# Patient Record
Sex: Female | Born: 1955
Health system: Southern US, Community
[De-identification: ages and names within clinical notes are randomized; demographics above are authoritative.]

## PROBLEM LIST (undated history)

## (undated) DIAGNOSIS — Z87898 Personal history of other specified conditions: Secondary | ICD-10-CM

## (undated) DIAGNOSIS — Z860100 Personal history of colon polyps, unspecified: Secondary | ICD-10-CM

## (undated) DIAGNOSIS — M810 Age-related osteoporosis without current pathological fracture: Secondary | ICD-10-CM

## (undated) DIAGNOSIS — N950 Postmenopausal bleeding: Secondary | ICD-10-CM

## (undated) DIAGNOSIS — K219 Gastro-esophageal reflux disease without esophagitis: Secondary | ICD-10-CM

## (undated) DIAGNOSIS — M2550 Pain in unspecified joint: Secondary | ICD-10-CM

## (undated) DIAGNOSIS — Z8709 Personal history of other diseases of the respiratory system: Secondary | ICD-10-CM

## (undated) DIAGNOSIS — Z8719 Personal history of other diseases of the digestive system: Secondary | ICD-10-CM

## (undated) HISTORY — PX: TUBAL LIGATION: SHX77

## (undated) HISTORY — PX: CYSTECTOMY: SUR359

## (undated) HISTORY — DX: Postmenopausal bleeding: N95.0

## (undated) HISTORY — DX: Gastro-esophageal reflux disease without esophagitis: K21.9

## (undated) HISTORY — PX: IRRIGATION AND DEBRIDEMENT SEBACEOUS CYST: SHX5255

## (undated) HISTORY — DX: Age-related osteoporosis without current pathological fracture: M81.0

---

## 1992-01-11 HISTORY — PX: OTHER SURGICAL HISTORY: SHX169

## 1997-06-12 ENCOUNTER — Other Ambulatory Visit: Admission: RE | Admit: 1997-06-12 | Discharge: 1997-06-12 | Payer: Self-pay | Admitting: Family Medicine

## 1997-10-07 ENCOUNTER — Other Ambulatory Visit: Admission: RE | Admit: 1997-10-07 | Discharge: 1997-10-07 | Payer: Self-pay | Admitting: Family Medicine

## 1998-10-29 ENCOUNTER — Encounter: Payer: Self-pay | Admitting: Family Medicine

## 1998-10-29 ENCOUNTER — Encounter: Admission: RE | Admit: 1998-10-29 | Discharge: 1998-10-29 | Payer: Self-pay | Admitting: Family Medicine

## 1999-08-06 ENCOUNTER — Encounter: Payer: Self-pay | Admitting: Family Medicine

## 1999-08-06 ENCOUNTER — Encounter: Admission: RE | Admit: 1999-08-06 | Discharge: 1999-08-06 | Payer: Self-pay | Admitting: Family Medicine

## 1999-09-17 ENCOUNTER — Encounter: Admission: RE | Admit: 1999-09-17 | Discharge: 1999-09-17 | Payer: Self-pay | Admitting: Family Medicine

## 1999-09-17 ENCOUNTER — Encounter: Payer: Self-pay | Admitting: Family Medicine

## 1999-11-01 ENCOUNTER — Encounter: Admission: RE | Admit: 1999-11-01 | Discharge: 1999-11-01 | Payer: Self-pay | Admitting: Obstetrics and Gynecology

## 1999-11-01 ENCOUNTER — Encounter: Payer: Self-pay | Admitting: Obstetrics and Gynecology

## 2000-10-03 ENCOUNTER — Other Ambulatory Visit: Admission: RE | Admit: 2000-10-03 | Discharge: 2000-10-03 | Payer: Self-pay | Admitting: Obstetrics and Gynecology

## 2000-11-02 ENCOUNTER — Encounter: Payer: Self-pay | Admitting: Obstetrics and Gynecology

## 2000-11-02 ENCOUNTER — Encounter: Admission: RE | Admit: 2000-11-02 | Discharge: 2000-11-02 | Payer: Self-pay | Admitting: Obstetrics and Gynecology

## 2001-10-10 ENCOUNTER — Other Ambulatory Visit: Admission: RE | Admit: 2001-10-10 | Discharge: 2001-10-10 | Payer: Self-pay | Admitting: Obstetrics and Gynecology

## 2001-11-15 ENCOUNTER — Encounter: Admission: RE | Admit: 2001-11-15 | Discharge: 2001-11-15 | Payer: Self-pay | Admitting: Obstetrics and Gynecology

## 2001-11-15 ENCOUNTER — Encounter: Payer: Self-pay | Admitting: Obstetrics and Gynecology

## 2002-10-22 ENCOUNTER — Other Ambulatory Visit: Admission: RE | Admit: 2002-10-22 | Discharge: 2002-10-22 | Payer: Self-pay | Admitting: Obstetrics and Gynecology

## 2002-11-19 ENCOUNTER — Encounter: Admission: RE | Admit: 2002-11-19 | Discharge: 2002-11-19 | Payer: Self-pay | Admitting: Obstetrics and Gynecology

## 2003-10-24 ENCOUNTER — Other Ambulatory Visit: Admission: RE | Admit: 2003-10-24 | Discharge: 2003-10-24 | Payer: Self-pay | Admitting: Obstetrics and Gynecology

## 2003-11-20 ENCOUNTER — Encounter: Admission: RE | Admit: 2003-11-20 | Discharge: 2003-11-20 | Payer: Self-pay | Admitting: Obstetrics and Gynecology

## 2004-06-09 ENCOUNTER — Ambulatory Visit (HOSPITAL_BASED_OUTPATIENT_CLINIC_OR_DEPARTMENT_OTHER): Admission: RE | Admit: 2004-06-09 | Discharge: 2004-06-09 | Payer: Self-pay | Admitting: *Deleted

## 2004-06-09 ENCOUNTER — Ambulatory Visit (HOSPITAL_COMMUNITY): Admission: RE | Admit: 2004-06-09 | Discharge: 2004-06-09 | Payer: Self-pay | Admitting: *Deleted

## 2004-06-09 ENCOUNTER — Encounter (INDEPENDENT_AMBULATORY_CARE_PROVIDER_SITE_OTHER): Payer: Self-pay | Admitting: *Deleted

## 2004-10-18 ENCOUNTER — Other Ambulatory Visit: Admission: RE | Admit: 2004-10-18 | Discharge: 2004-10-18 | Payer: Self-pay | Admitting: Obstetrics and Gynecology

## 2005-01-04 ENCOUNTER — Encounter: Admission: RE | Admit: 2005-01-04 | Discharge: 2005-01-04 | Payer: Self-pay | Admitting: Obstetrics and Gynecology

## 2005-05-02 ENCOUNTER — Emergency Department (HOSPITAL_COMMUNITY): Admission: EM | Admit: 2005-05-02 | Discharge: 2005-05-02 | Payer: Self-pay | Admitting: Emergency Medicine

## 2005-11-14 ENCOUNTER — Other Ambulatory Visit: Admission: RE | Admit: 2005-11-14 | Discharge: 2005-11-14 | Payer: Self-pay | Admitting: Obstetrics & Gynecology

## 2005-12-10 DIAGNOSIS — N950 Postmenopausal bleeding: Secondary | ICD-10-CM

## 2005-12-10 HISTORY — DX: Postmenopausal bleeding: N95.0

## 2005-12-12 ENCOUNTER — Ambulatory Visit (HOSPITAL_COMMUNITY): Admission: RE | Admit: 2005-12-12 | Discharge: 2005-12-12 | Payer: Self-pay | Admitting: Gastroenterology

## 2005-12-12 ENCOUNTER — Encounter (INDEPENDENT_AMBULATORY_CARE_PROVIDER_SITE_OTHER): Payer: Self-pay | Admitting: Specialist

## 2005-12-12 HISTORY — PX: COLONOSCOPY W/ BIOPSIES: SHX1374

## 2006-01-25 ENCOUNTER — Encounter: Admission: RE | Admit: 2006-01-25 | Discharge: 2006-01-25 | Payer: Self-pay | Admitting: Obstetrics and Gynecology

## 2006-11-20 ENCOUNTER — Other Ambulatory Visit: Admission: RE | Admit: 2006-11-20 | Discharge: 2006-11-20 | Payer: Self-pay | Admitting: Obstetrics & Gynecology

## 2007-05-07 ENCOUNTER — Encounter: Admission: RE | Admit: 2007-05-07 | Discharge: 2007-05-07 | Payer: Self-pay | Admitting: Obstetrics and Gynecology

## 2007-06-15 ENCOUNTER — Ambulatory Visit (HOSPITAL_COMMUNITY): Admission: RE | Admit: 2007-06-15 | Discharge: 2007-06-15 | Payer: Self-pay | Admitting: Otolaryngology

## 2008-01-14 ENCOUNTER — Other Ambulatory Visit: Admission: RE | Admit: 2008-01-14 | Discharge: 2008-01-14 | Payer: Self-pay | Admitting: Obstetrics and Gynecology

## 2008-05-12 ENCOUNTER — Encounter: Admission: RE | Admit: 2008-05-12 | Discharge: 2008-05-12 | Payer: Self-pay | Admitting: Obstetrics and Gynecology

## 2008-07-08 ENCOUNTER — Emergency Department (HOSPITAL_COMMUNITY): Admission: EM | Admit: 2008-07-08 | Discharge: 2008-07-08 | Payer: Self-pay | Admitting: Emergency Medicine

## 2008-10-15 ENCOUNTER — Encounter: Payer: Self-pay | Admitting: Internal Medicine

## 2008-10-15 LAB — CONVERTED CEMR LAB
Cholesterol: 223 mg/dL
HDL: 83 mg/dL
LDL Cholesterol: 102 mg/dL
TSH: 1.67 microintl units/mL
Triglyceride fasting, serum: 81 mg/dL

## 2009-04-10 ENCOUNTER — Encounter: Payer: Self-pay | Admitting: Internal Medicine

## 2009-04-10 LAB — CONVERTED CEMR LAB

## 2009-05-10 LAB — HM DEXA SCAN

## 2009-05-18 ENCOUNTER — Encounter: Admission: RE | Admit: 2009-05-18 | Discharge: 2009-05-18 | Payer: Self-pay | Admitting: Obstetrics and Gynecology

## 2010-01-31 ENCOUNTER — Encounter: Payer: Self-pay | Admitting: Obstetrics and Gynecology

## 2010-03-08 ENCOUNTER — Other Ambulatory Visit: Payer: Self-pay | Admitting: Internal Medicine

## 2010-03-08 ENCOUNTER — Other Ambulatory Visit: Payer: Commercial Managed Care - PPO

## 2010-03-08 ENCOUNTER — Encounter (INDEPENDENT_AMBULATORY_CARE_PROVIDER_SITE_OTHER): Payer: Self-pay | Admitting: *Deleted

## 2010-03-08 DIAGNOSIS — Z0000 Encounter for general adult medical examination without abnormal findings: Secondary | ICD-10-CM

## 2010-03-08 LAB — CBC WITH DIFFERENTIAL/PLATELET
Basophils Absolute: 0 10*3/uL (ref 0.0–0.1)
Basophils Relative: 0.3 % (ref 0.0–3.0)
Eosinophils Absolute: 0.2 10*3/uL (ref 0.0–0.7)
Eosinophils Relative: 2.7 % (ref 0.0–5.0)
HCT: 39.9 % (ref 36.0–46.0)
Hemoglobin: 14.1 g/dL (ref 12.0–15.0)
Lymphocytes Relative: 20.2 % (ref 12.0–46.0)
Lymphs Abs: 1.4 10*3/uL (ref 0.7–4.0)
MCHC: 35.3 g/dL (ref 30.0–36.0)
MCV: 93.9 fl (ref 78.0–100.0)
Monocytes Absolute: 0.8 10*3/uL (ref 0.1–1.0)
Monocytes Relative: 11.2 % (ref 3.0–12.0)
Neutro Abs: 4.6 10*3/uL (ref 1.4–7.7)
Neutrophils Relative %: 65.6 % (ref 43.0–77.0)
Platelets: 207 10*3/uL (ref 150.0–400.0)
RBC: 4.25 Mil/uL (ref 3.87–5.11)
RDW: 12.7 % (ref 11.5–14.6)
WBC: 7 10*3/uL (ref 4.5–10.5)

## 2010-03-08 LAB — URINALYSIS, ROUTINE W REFLEX MICROSCOPIC
Bilirubin Urine: NEGATIVE
Hgb urine dipstick: NEGATIVE
Ketones, ur: NEGATIVE
Nitrite: NEGATIVE
Specific Gravity, Urine: 1.025 (ref 1.000–1.030)
Total Protein, Urine: NEGATIVE
Urine Glucose: NEGATIVE
Urobilinogen, UA: 0.2 (ref 0.0–1.0)
pH: 6 (ref 5.0–8.0)

## 2010-03-08 LAB — LIPID PANEL
Cholesterol: 178 mg/dL (ref 0–200)
HDL: 63.7 mg/dL (ref >39.00)
LDL Cholesterol: 99 mg/dL (ref 0–99)
Total CHOL/HDL Ratio: 3
Triglycerides: 75 mg/dL (ref 0.0–149.0)
VLDL: 15 mg/dL (ref 0.0–40.0)

## 2010-03-08 LAB — HEPATIC FUNCTION PANEL
ALT: 16 U/L (ref 0–35)
AST: 19 U/L (ref 0–37)
Albumin: 4.2 g/dL (ref 3.5–5.2)
Alkaline Phosphatase: 43 U/L (ref 39–117)
Bilirubin, Direct: 0.1 mg/dL (ref 0.0–0.3)
Total Bilirubin: 0.6 mg/dL (ref 0.3–1.2)
Total Protein: 6.6 g/dL (ref 6.0–8.3)

## 2010-03-08 LAB — BASIC METABOLIC PANEL
BUN: 15 mg/dL (ref 6–23)
CO2: 29 mEq/L (ref 19–32)
Calcium: 9.1 mg/dL (ref 8.4–10.5)
Chloride: 105 mEq/L (ref 96–112)
Creatinine, Ser: 0.7 mg/dL (ref 0.4–1.2)
GFR: 90.7 mL/min (ref >60.00)
Glucose, Bld: 79 mg/dL (ref 70–99)
Potassium: 4.7 mEq/L (ref 3.5–5.1)
Sodium: 141 mEq/L (ref 135–145)

## 2010-03-08 LAB — TSH: TSH: 2.21 u[IU]/mL (ref 0.35–5.50)

## 2010-03-11 DIAGNOSIS — K219 Gastro-esophageal reflux disease without esophagitis: Secondary | ICD-10-CM | POA: Insufficient documentation

## 2010-03-12 ENCOUNTER — Encounter: Payer: Self-pay | Admitting: Internal Medicine

## 2010-03-12 ENCOUNTER — Ambulatory Visit (INDEPENDENT_AMBULATORY_CARE_PROVIDER_SITE_OTHER): Payer: Commercial Managed Care - PPO | Admitting: Internal Medicine

## 2010-03-12 DIAGNOSIS — H9319 Tinnitus, unspecified ear: Secondary | ICD-10-CM | POA: Insufficient documentation

## 2010-03-12 DIAGNOSIS — Z0000 Encounter for general adult medical examination without abnormal findings: Secondary | ICD-10-CM

## 2010-03-12 DIAGNOSIS — Z860100 Personal history of colon polyps, unspecified: Secondary | ICD-10-CM | POA: Insufficient documentation

## 2010-03-18 NOTE — Assessment & Plan Note (Signed)
Summary: New pt Cpx,#,umr,cd   Vital Signs:  Patient profile:   55 year old female Height:      63.5 inches (161.29 cm) Weight:      135.12 pounds (61.42 kg) BMI:     23.65 O2 Sat:      99 % on Room air Temp:     97.8 degrees F (36.56 degrees C) oral Pulse rate:   83 / minute BP sitting:   118 / 80  (left arm) Cuff size:   regular  Vitals Entered By: Orlan Leavens RMA (March 12, 2010 8:15 AM)  O2 Flow:  Room air CC: New patient CPX Is Patient Diabetic? No Pain Assessment Patient in pain? no        Primary Care Provider:  Newt Lukes MD  CC:  New patient CPX.  History of Present Illness: new pt to me and our practice, here to est care patient is here today for annual physical. Patient feels well and has no complaints.   Preventive Screening-Counseling & Management  Alcohol-Tobacco     Alcohol drinks/day: <1     Alcohol Counseling: not indicated; use of alcohol is not excessive or problematic     Smoking Status: never     Tobacco Counseling: not indicated; no tobacco use  Caffeine-Diet-Exercise     Diet Counseling: not indicated; diet is assessed to be healthy     Does Patient Exercise: no     Exercise Counseling: to improve exercise regimen     Depression Counseling: not indicated; screening negative for depression  Safety-Violence-Falls     Seat Belt Counseling: not indicated; patient wears seat belts     Helmet Counseling: not indicated; patient wears helmet when riding bicycle/motocycle     Firearm Counseling: not indicated; uses recommended firearm safety measures     Violence Counseling: not indicated; no violence risk noted  Clinical Review Panels:  Prevention   Last Mammogram:  [facility].   No specific mammographic evidence of malignancy.  Assessment: BIRADS 1. (05/18/2009)   Last Pap Smear:  Interpretation Result:Negative for intraepithelial Lesion or Malignancy.    (04/10/2009)  Immunizations   Last Tetanus  Booster:  Historical (01/10/2006)   Last Flu Vaccine:  Historical (11/10/2009)  Lipid Management   Cholesterol:  178 (03/08/2010)   LDL (bad choesterol):  99 (03/08/2010)   HDL (good cholesterol):  63.70 (03/08/2010)  CBC   WBC:  7.0 (03/08/2010)   RBC:  4.25 (03/08/2010)   Hgb:  14.1 (03/08/2010)   Hct:  39.9 (03/08/2010)   Platelets:  207.0 (03/08/2010)   MCV  93.9 (03/08/2010)   MCHC  35.3 (03/08/2010)   RDW  12.7 (03/08/2010)   PMN:  65.6 (03/08/2010)   Lymphs:  20.2 (03/08/2010)   Monos:  11.2 (03/08/2010)   Eosinophils:  2.7 (03/08/2010)   Basophil:  0.3 (03/08/2010)  Complete Metabolic Panel   Glucose:  79 (03/08/2010)   Sodium:  141 (03/08/2010)   Potassium:  4.7 (03/08/2010)   Chloride:  105 (03/08/2010)   CO2:  29 (03/08/2010)   BUN:  15 (03/08/2010)   Creatinine:  0.7 (03/08/2010)   Albumin:  4.2 (03/08/2010)   Total Protein:  6.6 (03/08/2010)   Calcium:  9.1 (03/08/2010)   Total Bili:  0.6 (03/08/2010)   Alk Phos:  43 (03/08/2010)   SGPT (ALT):  16 (03/08/2010)   SGOT (AST):  19 (03/08/2010)   Allergies: 1)  ! Sulfa  Past History:  Past Medical History: GERD  Colonic polyps, hx tinnitus with hearing loss L side  MD roster: gyn -romine GI - mann ENT - byers  Past Surgical History: Sebaceous Cyst removed x's 2 Tubal ligation  Family History: Family History of Arthritis (parents) Family History of Colon CA 1st degree relative <60 (grandparent) Mother SSS-Pacemaker Afib/RVR  Social History: Never Smoked, social wine use divorced, lives with boyfriend no children employed as PT at [employer]  Smoking Status:  never Does Patient Exercise:  no  Review of Systems       see HPI above. I have reviewed all other systems and they were negative.   Physical Exam  General:  alert, well-developed, well-nourished, and cooperative to examination.    Head:  Normocephalic and atraumatic without obvious abnormalities. No apparent alopecia or  balding. Eyes:  vision grossly intact; pupils equal, round and reactive to light.  conjunctiva and lids normal.    Ears:  diminished hearing on L - normal pinnae bilaterally, without erythema, swelling, or tenderness to palpation. TMs clear, without effusion, or cerumen impaction. Nose:  External nasal examination shows no deformity or inflammation. Nasal mucosa are pink and moist without lesions or exudates. Mouth:  teeth and gums in good repair; mucous membranes moist, without lesions or ulcers. oropharynx clear without exudate, no erythema.  Neck:  supple, full ROM, no masses, no thyromegaly; no thyroid nodules or tenderness. no JVD or carotid bruits.   Lungs:  normal respiratory effort, no intercostal retractions or use of accessory muscles; normal breath sounds bilaterally - no crackles and no wheezes.    Heart:  normal rate, regular rhythm, no murmur, and no rub. BLE without edema. normal DP pulses and normal cap refill in all 4 extremities    Abdomen:  soft, non-tender, normal bowel sounds, no distention; no masses and no appreciable hepatomegaly or splenomegaly.   Genitalia:  defer gyn Msk:  No deformity or scoliosis noted of thoracic or lumbar spine.   Neurologic:  alert & oriented X3 and cranial nerves II-XII symetrically intact.  strength normal in all extremities, sensation intact to light touch, and gait normal. speech fluent without dysarthria or aphasia; follows commands with good comprehension.  Skin:  no rashes, vesicles, ulcers, or erythema. No nodules or irregularity to palpation.  Psych:  Oriented X3, memory intact for recent and remote, normally interactive, good eye contact, not anxious appearing, not depressed appearing, and not agitated.      Impression & Recommendations:  Problem # 1:  PREVENTIVE HEALTH CARE (ICD-V70.0) Patient has been counseled on age-appropriate routine health concerns for screening and prevention. These are reviewed and up-to-date. Immunizations are  up-to-date or declined. Labs and ECG reviewed.  Orders: EKG w/ Interpretation (93000)  Problem # 2:  GERD (ICD-530.81)  Her updated medication list for this problem includes:    Nexium 40 Mg Cpdr (Esomeprazole magnesium) .Marland Kitchen... Take 1 by mouth once daily  Complete Medication List: 1)  Nexium 40 Mg Cpdr (Esomeprazole magnesium) .... Take 1 by mouth once daily  Patient Instructions: 1)  it was good to see you today. 2)  health history and medications reviewed - no changes 3)  exam, labs and EKG look great 4)  stay active! 5)  Please schedule a follow-up appointment annually for medical physcial and labs, call sooner if problems.  Prescriptions: NEXIUM 40 MG CPDR (ESOMEPRAZOLE MAGNESIUM) take 1 by mouth once daily  #90 x 3   Entered and Authorized by:   Newt Lukes MD   Signed by:   Vikki Ports  Edsel Petrin MD on 03/12/2010   Method used:   Electronically to        [facility]* (retail)       [address]       [address] Shipping/mailing       [place], [place]  [address]       Ph: [phone]       Fax: [phone]   RxID:   [id number]    Orders Added: 1)  EKG w/ Interpretation [93000] 2)  New Patient 40-64 years [99386]   Immunization History:  Tetanus/Td Immunization History:    Tetanus/Td:  historical (01/10/2006)  Influenza Immunization History:    Influenza:  historical (11/10/2009)   Immunization History:  Tetanus/Td Immunization History:    Tetanus/Td:  Historical (01/10/2006)  Influenza Immunization History:    Influenza:  Historical (11/10/2009)   Pap Smear  Procedure date:  04/10/2009  Findings:      Interpretation Result:Negative for intraepithelial Lesion or Malignancy.

## 2010-03-22 ENCOUNTER — Encounter: Payer: Self-pay | Admitting: Internal Medicine

## 2010-04-22 ENCOUNTER — Other Ambulatory Visit: Payer: Self-pay | Admitting: Obstetrics and Gynecology

## 2010-04-22 DIAGNOSIS — Z1231 Encounter for screening mammogram for malignant neoplasm of breast: Secondary | ICD-10-CM

## 2010-05-28 ENCOUNTER — Ambulatory Visit: Payer: Commercial Managed Care - PPO

## 2010-05-28 NOTE — Op Note (Signed)
NAME:  [name], [name]                  ACCOUNT NO.:  [id number]   MEDICAL RECORD NO.:  [id number]          PATIENT TYPE:  AMB   LOCATION:  ENDO                         FACILITY:  [facility]   PHYSICIAN:  Anselmo Rod, M.D.  DATE OF BIRTH:  1956/01/09   DATE OF PROCEDURE:  12/12/2005  DATE OF DISCHARGE:                               OPERATIVE REPORT   PROCEDURE PERFORMED:  Colonoscopy with snare polypectomy x1 and cold  biopsies x1.   ENDOSCOPIST:  Anselmo Rod, MD   INSTRUMENT USED:  Olympus video colonoscope.   INDICATIONS FOR PROCEDURE:  55 year old white female undergoing  screening colonoscopy.  The patient has occasional rectal bleeding which  she attributes to her hemorrhoids.  Rule out colonic polyps, masses,  etc.   PREPROCEDURE PREPARATION:  Informed consent was procured from the  patient.  The patient was fasted for hours prior to the procedure after  being prepped with 20 Osmoprep pills the night of and 12 Osmoprep pills  the morning of the procedure. The  risks and benefits of the procedure  including a 10% miss rate of cancer and polyps were discussed with the  patient as well.   PREPROCEDURE PHYSICAL:  The patient had stable vital signs.  NECK:  Supple.  Chest clear to auscultation.  S1, S2 regular.  Abdomen soft  with normal bowel sounds.   DESCRIPTION OF PROCEDURE:  The patient was placed in the left lateral  decubitus position and sedated with 140 mcg of fentanyl and 10 mg of  Versed given intravenously in slow incremental doses.  Once the patient  was adequately sedated and maintained on low-flow oxygen and continuous  cardiac monitoring, the Olympus video colonoscope was advanced from the  rectum to the cecum.  The patient had a fairly good prep.  Multiple  washes were done.  The appendiceal orifice and ileocecal valve were  clearly visualized and photographed.  A small sessile polyp was removed  by hot snare times one from 20 cm.  Another small sessile polyp  was  biopsied from the rectum (cold biopsies x1).  A few early sigmoid  diverticula were noted.  Small internal hemorrhoids were appreciated on  retroflexion in the rectum.  The patient tolerated the procedure well  without complications.  1. Small nonbleeding internal hemorrhoid.  2. A few early sigmoid diverticula.  3. Small sessile polyp biopsied from the rectum and one removed by hot      snare from 20 cm.  Normal exam beyond 20 cm up to the cecum.   RECOMMENDATIONS:  1. Await pathology results.  2. Avoid all nonsteroidals and aspirin for the next 2 weeks.  3. Repeat colonoscopy depending on pathology results.  4. A high-fiber diet with liberal fluid intake has been advised.  5. Outpatient follow-up as need arises in the future.      Anselmo Rod, M.D.  Electronically Signed     JNM/MEDQ  D:  12/12/2005  T:  12/12/2005  Job:  [id number]   cc:   Edwena Felty. Romine, M.D.  Quita Skye Artis Flock, M.D.

## 2010-05-28 NOTE — Op Note (Signed)
NAMESAMIRAH, SCARPATI                  ACCOUNT NO.:  [id number]   MEDICAL RECORD NO.:  [id number]          PATIENT TYPE:  AMB   LOCATION:  DSC                          FACILITY:  [facility]   PHYSICIAN:  Vikki Ports, MDDATE OF BIRTH:  1955-01-26   DATE OF PROCEDURE:  06/09/2004  DATE OF DISCHARGE:                                 OPERATIVE REPORT   PREOPERATIVE DIAGNOSIS:  Epidermoid cyst to the left labia majora.   POSTOPERATIVE DIAGNOSIS:  Epidermoid cyst to the left labia majora.   PROCEDURE:  Excision of epidermoid cyst of the left labia majora.   ANESTHESIA:  MAC.   SURGEON:  Vikki Ports, M.D.   DESCRIPTION OF PROCEDURE:  Patient was taken to the operating room, placed  in the supine position.  After adequate anesthesia was induced using MAC  technique, patient was placed in the lithotomy position and perineum was  prepped.  Lidocaine 1% local anesthesia was used to anesthetize the area  overlying and around the cyst.  Elliptical incision was made.  The cyst was  excised in its entirety and the wound was closed with interrupted 3-0  Monocryl.  Dermabond was placed.  Patient tolerated the procedure well and  went to PACU in good condition.      KRH/MEDQ  D:  06/09/2004  T:  06/09/2004  Job:  [id number]

## 2010-05-31 ENCOUNTER — Ambulatory Visit
Admission: RE | Admit: 2010-05-31 | Discharge: 2010-05-31 | Disposition: A | Discharge status: Home / Self Care | Payer: Commercial Managed Care - PPO | Source: Ambulatory Visit | Attending: Obstetrics and Gynecology | Admitting: Obstetrics and Gynecology

## 2010-05-31 DIAGNOSIS — Z1231 Encounter for screening mammogram for malignant neoplasm of breast: Secondary | ICD-10-CM

## 2010-09-11 HISTORY — PX: ABSCESS DRAINAGE: SHX1119

## 2010-09-16 ENCOUNTER — Ambulatory Visit (INDEPENDENT_AMBULATORY_CARE_PROVIDER_SITE_OTHER): Payer: Commercial Managed Care - PPO | Admitting: General Surgery

## 2010-09-16 ENCOUNTER — Encounter (INDEPENDENT_AMBULATORY_CARE_PROVIDER_SITE_OTHER): Payer: Self-pay | Admitting: General Surgery

## 2010-09-16 VITALS — BP 123/80 | HR 67 | Temp 97.8°F | Ht 63.0 in | Wt 136.8 lb

## 2010-09-16 DIAGNOSIS — L0291 Cutaneous abscess, unspecified: Secondary | ICD-10-CM

## 2010-09-16 DIAGNOSIS — L02219 Cutaneous abscess of trunk, unspecified: Secondary | ICD-10-CM

## 2010-09-16 DIAGNOSIS — L0390 Cellulitis, unspecified: Secondary | ICD-10-CM

## 2010-09-16 DIAGNOSIS — L02214 Cutaneous abscess of groin: Secondary | ICD-10-CM

## 2010-09-16 MED ORDER — DOXYCYCLINE HYCLATE 100 MG PO TABS: 100.0000 mg | ORAL_TABLET | Freq: Two times a day (BID) | ORAL | Status: AC

## 2010-09-16 NOTE — Progress Notes (Signed)
Subjective:     Patient ID: [name], female   DOB: 09/25/1955, 55 y.o.   MRN: [id number]  HPI Patient self-referred for new onset abscess suprapubic region. She's had increasing pain but minimal drainage for a couple days. She previously has had a labial cyst and what sounds at the perianal or perirectal cyst drained in the past.  Review of Systems     Objective:   Physical Exam    Abdomen soft nontender bowel sounds are normal no masses are felt, and suprapubic region demonstrates a 1 cm indurated and erythematous papule consistent with localized abscess. Procedure note: There was prepped in sterile fashion 1% lidocaine with epinephrine was injected for local anesthetic. The area was incised and drained. There is a small underlying cavity with purulent material. This was sent for culture. There was no significant bleeding. A small gauze dressing was applied. Assessment:     Suprapubic abscess Incised and drained as above    Plan:    Localized wound care was instructed. The patient is a physical therapist and medically savvy. Doxycycline. We will follow cultures and return in one week.

## 2010-09-19 LAB — WOUND CULTURE
Gram Stain: NONE SEEN
Gram Stain: NONE SEEN

## 2010-09-21 ENCOUNTER — Encounter (INDEPENDENT_AMBULATORY_CARE_PROVIDER_SITE_OTHER): Payer: Self-pay | Admitting: General Surgery

## 2010-09-23 ENCOUNTER — Encounter (INDEPENDENT_AMBULATORY_CARE_PROVIDER_SITE_OTHER): Payer: Self-pay | Admitting: General Surgery

## 2010-09-23 ENCOUNTER — Ambulatory Visit (INDEPENDENT_AMBULATORY_CARE_PROVIDER_SITE_OTHER): Payer: 59 | Admitting: General Surgery

## 2010-09-23 VITALS — BP 111/61 | HR 70

## 2010-09-23 DIAGNOSIS — L089 Local infection of the skin and subcutaneous tissue, unspecified: Secondary | ICD-10-CM

## 2010-09-23 DIAGNOSIS — L723 Sebaceous cyst: Secondary | ICD-10-CM

## 2010-09-23 NOTE — Progress Notes (Signed)
Subjective:     Patient ID: [name], female   DOB: 1955-04-22, 55 y.o.   MRN: [id number]  HPI She follows up today for recheck of her suprapubic area for which she underwent incision and drainage of an infected skin cyst one week ago. She has been taking doxycycline. She states that she feels much better and has been having discomfort or fevers. Cultures were consistent with coag negative staph  Review of Systems     Objective:   Physical Exam Her incision has healed and she has no residual erythema or tenderness. There is no drainage. She has some residual induration but no sign of infection.    Assessment:     Status post incision and drainage of left leg abscess-resolving    Plan:     This is likely an infected sebaceous cyst but has resolved. I recommended that she continue her antibiotics for another 3 days. I explained that this may recur if she has a residual skin cyst and if she chooses we can provide definitive excision of the cyst to prevent recurrence.She is going call me back after this completely healed if she is interested in definitive excision.

## 2010-11-18 ENCOUNTER — Emergency Department (INDEPENDENT_AMBULATORY_CARE_PROVIDER_SITE_OTHER)
Admission: EM | Admit: 2010-11-18 | Discharge: 2010-11-18 | Disposition: A | Discharge status: Home / Self Care | Payer: 59 | Source: Home / Self Care | Attending: Family Medicine | Admitting: Family Medicine

## 2010-11-18 ENCOUNTER — Encounter (HOSPITAL_COMMUNITY): Payer: Self-pay

## 2010-11-18 DIAGNOSIS — H8109 Meniere's disease, unspecified ear: Secondary | ICD-10-CM

## 2010-11-18 MED ORDER — MECLIZINE HCL 25 MG PO TABS: 25.0000 mg | ORAL_TABLET | Freq: Three times a day (TID) | ORAL | Status: AC | PRN

## 2010-11-18 NOTE — ED Provider Notes (Signed)
History     CSN: [id number] Arrival date & time: 11/18/2010  1:39 PM   First MD Initiated Contact with Patient 11/18/10 1356      Chief Complaint  Patient presents with  . Dizziness    (Consider location/radiation/quality/duration/timing/severity/associated sxs/prior treatment) HPI Comments: Pt reports Lt periauricular and temple area pressure, vertigo with change of positions and movement of head, and increased tinnitus x 4-5 days. Hx of Meniere's with chronic hearing loss Lt ear and tinnitus. No nasal congestion, post nasal drainage, sore throat, cough or fever. Has seen ENT in past and had neg MRI for acoustic neuroma.   Patient is a 55 y.o. female presenting with neurologic complaint. The history is provided by the patient.  Neurologic Problem The primary symptoms include dizziness and speech change. Primary symptoms do not include headaches, syncope, loss of consciousness, altered mental status, visual change, paresthesias, focal weakness, fever, nausea or vomiting. The symptoms began 3 to 5 days ago. The symptoms are unchanged. Context: change of positions and movement of head.  She describes the dizziness as a sensation of spinning and lightheadedness. The dizziness began 2 to 5 days ago. The dizziness has been unchanged since its onset. Dizziness also occurs with tinnitus (increased). Dizziness does not occur with blurred vision, nausea, vomiting, weakness or diaphoresis.  Additional symptoms include hearing loss (unchanged), tinnitus (increased) and vertigo. Additional symptoms do not include weakness or photophobia. Associated medical issues comments: Hx of tinnitus and Lt hearing loss due to Meniere's.    Past Medical History  Diagnosis Date  . GERD (gastroesophageal reflux disease)     Past Surgical History  Procedure Date  . Tubal ligation   . Irrigation and debridement sebaceous cyst 1980  . Abscess drainage     No family history on file.  History  Substance Use  Topics  . Smoking status: Never Smoker   . Smokeless tobacco: Never Used  . Alcohol Use: Yes    OB History    Grav Para Term Preterm Abortions TAB SAB Ect Mult Living                  Review of Systems  Constitutional: Negative for fever, chills and diaphoresis.  HENT: Positive for hearing loss (unchanged) and tinnitus (increased). Negative for ear pain, nosebleeds, congestion, sore throat, rhinorrhea, sneezing, postnasal drip, sinus pressure and ear discharge.   Eyes: Negative for blurred vision, photophobia, pain and redness.  Respiratory: Negative for cough, chest tightness and shortness of breath.   Cardiovascular: Negative for chest pain, palpitations and syncope.  Gastrointestinal: Negative for nausea and vomiting.  Neurological: Positive for dizziness, vertigo and speech change. Negative for focal weakness, loss of consciousness, syncope, speech difficulty, weakness, numbness, headaches and paresthesias.  Psychiatric/Behavioral: Negative for altered mental status.    Allergies  Latex; Sulfonamide derivatives; and Terfenadine  Home Medications   Current Outpatient Rx  Name Route Sig Dispense Refill  . ESOMEPRAZOLE MAGNESIUM 10 MG PO PACK Oral Take 10 mg by mouth daily before breakfast.      . MULTI-VITAMIN/MINERALS PO TABS Oral Take 1 tablet by mouth daily.        There were no vitals taken for this visit.  Physical Exam  Nursing note and vitals reviewed. Constitutional: She is oriented to person, place, and time. She appears well-developed and well-nourished. No distress.  HENT:  Head: Normocephalic and atraumatic.  Right Ear: Tympanic membrane, external ear and ear canal normal.  Left Ear: Tympanic membrane, external ear and ear canal  normal.  Nose: Nose normal. Right sinus exhibits no maxillary sinus tenderness and no frontal sinus tenderness. Left sinus exhibits no maxillary sinus tenderness and no frontal sinus tenderness.  Mouth/Throat: Uvula is midline,  oropharynx is clear and moist and mucous membranes are normal. No oropharyngeal exudate.  Eyes: Conjunctivae and EOM are normal. Pupils are equal, round, and reactive to light. Right eye exhibits no discharge. Left eye exhibits no discharge. No scleral icterus.  Neck: Normal range of motion. Neck supple. No thyromegaly present.  Cardiovascular: Normal rate, regular rhythm and normal heart sounds.   Pulmonary/Chest: Effort normal and breath sounds normal. No respiratory distress.  Neurological: She is alert and oriented to person, place, and time. She has normal strength and normal reflexes. No cranial nerve deficit. Gait normal.  Skin: Skin is warm and dry.  Psychiatric: She has a normal mood and affect.    ED Course  Procedures (including critical care time)  Labs Reviewed - No data to display No results found.   No diagnosis found.    MDM          Melody Comas, PA 11/18/10 1436

## 2010-11-18 NOTE — ED Notes (Signed)
C/o lt side sinus congestion, lt ear roaring and ringing, dizziness with position change for several days.

## 2010-11-22 NOTE — ED Provider Notes (Signed)
Medical screening examination/treatment/procedure(s) were performed by non-physician practitioner and as supervising physician I was immediately available for consultation/collaboration.  Jonthan Leite G  D.O.    Adria Costley G Karisa Nesser, MD 11/22/10 1605 

## 2011-03-24 ENCOUNTER — Other Ambulatory Visit: Payer: Self-pay | Admitting: Internal Medicine

## 2011-04-04 ENCOUNTER — Telehealth: Payer: Self-pay | Admitting: *Deleted

## 2011-04-04 DIAGNOSIS — Z0000 Encounter for general adult medical examination without abnormal findings: Secondary | ICD-10-CM

## 2011-04-04 NOTE — Telephone Encounter (Signed)
Received staff msg pt schedule cpx needing labs entered in epic... 04/04/11@10 :41am/LMB

## 2011-05-09 ENCOUNTER — Encounter: Payer: Self-pay | Admitting: *Deleted

## 2011-05-09 ENCOUNTER — Ambulatory Visit (INDEPENDENT_AMBULATORY_CARE_PROVIDER_SITE_OTHER): Payer: 59 | Admitting: Internal Medicine

## 2011-05-09 ENCOUNTER — Encounter: Payer: Self-pay | Admitting: Internal Medicine

## 2011-05-09 VITALS — BP 120/82 | HR 75 | Temp 98.5°F | Ht 63.0 in

## 2011-05-09 DIAGNOSIS — R059 Cough, unspecified: Secondary | ICD-10-CM

## 2011-05-09 DIAGNOSIS — J320 Chronic maxillary sinusitis: Secondary | ICD-10-CM

## 2011-05-09 DIAGNOSIS — R05 Cough: Secondary | ICD-10-CM

## 2011-05-09 MED ORDER — AMOXICILLIN-POT CLAVULANATE 875-125 MG PO TABS: 1.0000 | ORAL_TABLET | Freq: Two times a day (BID) | ORAL | Status: DC

## 2011-05-09 MED ORDER — PROMETHAZINE-PHENYLEPHRINE 6.25-5 MG/5ML PO SYRP: 5.0000 mL | ORAL_SOLUTION | ORAL | Status: DC | PRN

## 2011-05-09 NOTE — Progress Notes (Signed)
  Subjective:    HPI  complains of L max sinus pain, ?infection Onset >1 week ago, wax/wane symptoms  associated with rhinorrhea, sneezing, ear fullness, mild headache and low grade fever Also myalgias, sinus pressure and mild-mod chest congestion - cough worst at night No relief with OTC meds Precipitated by sick contacts  Past Medical History  Diagnosis Date  . GERD (gastroesophageal reflux disease)   . Meniere's disease     L  hearing loss related to same    Review of Systems Constitutional: No fever or night sweats Pulmonary: No pleurisy or hemoptysis Cardiovascular: No chest pain or palpitations     Objective:   Physical Exam BP 120/82  Pulse 75  Temp(Src) 98.5 F (36.9 C) (Oral)  Ht 5\' 3"  (1.6 m)  SpO2 97% GEN: mildly ill appearing and audible head chest congestion HENT: NCAT, mild sinus tenderness L maxillary region, nares with clear discharge, oropharynx mild erythema, no exudate Eyes: Vision grossly intact, no conjunctivitis Lungs: Clear to auscultation without rhonchi or wheeze, no increased work of breathing Cardiovascular: Regular rate and rhythm, no bilateral edema      Assessment & Plan:  L max sinusitis  Cough, postnasal drip related to above    Empiric antibiotics prescribed due to symptom duration greater than 7 days Prescription cough suppression - new prescriptions done Symptomatic care with Tylenol or Advil, hydration and rest - Work excuse note for today

## 2011-05-09 NOTE — Patient Instructions (Signed)
It was good to see you today. Augmentin antibiotics and promethazine VC for cough and congestion as needed (especially nighttime symptoms)  Your prescription(s) have been submitted to your pharmacy. Please take as directed and contact our office if you believe you are having problem(s) with the medication(s). Alternate between ibuprofen and tylenol for aches, pain and fever symptoms as discussed Work excuse note for today provided

## 2011-05-11 ENCOUNTER — Other Ambulatory Visit (INDEPENDENT_AMBULATORY_CARE_PROVIDER_SITE_OTHER): Payer: 59

## 2011-05-11 DIAGNOSIS — Z0000 Encounter for general adult medical examination without abnormal findings: Secondary | ICD-10-CM

## 2011-05-11 LAB — URINALYSIS, ROUTINE W REFLEX MICROSCOPIC
Hgb urine dipstick: NEGATIVE
Nitrite: NEGATIVE
Specific Gravity, Urine: 1.01 (ref 1.000–1.030)
Total Protein, Urine: NEGATIVE
Urine Glucose: NEGATIVE
Urobilinogen, UA: 0.2 (ref 0.0–1.0)

## 2011-05-11 LAB — BASIC METABOLIC PANEL
CO2: 30 mEq/L (ref 19–32)
Calcium: 8.9 mg/dL (ref 8.4–10.5)
GFR: 88.87 mL/min (ref >60.00)
Glucose, Bld: 85 mg/dL (ref 70–99)
Potassium: 4.4 mEq/L (ref 3.5–5.1)
Sodium: 142 mEq/L (ref 135–145)

## 2011-05-11 LAB — CBC WITH DIFFERENTIAL/PLATELET
Basophils Absolute: 0 10*3/uL (ref 0.0–0.1)
Eosinophils Relative: 3.6 % (ref 0.0–5.0)
Lymphs Abs: 2.2 10*3/uL (ref 0.7–4.0)
Monocytes Relative: 9.6 % (ref 3.0–12.0)
Neutrophils Relative %: 55.1 % (ref 43.0–77.0)
Platelets: 248 10*3/uL (ref 150.0–400.0)
RDW: 12.8 % (ref 11.5–14.6)
WBC: 6.9 10*3/uL (ref 4.5–10.5)

## 2011-05-11 LAB — LIPID PANEL
HDL: 77.3 mg/dL (ref >39.00)
LDL Cholesterol: 80 mg/dL (ref 0–99)
Total CHOL/HDL Ratio: 2
Triglycerides: 83 mg/dL (ref 0.0–149.0)
VLDL: 16.6 mg/dL (ref 0.0–40.0)

## 2011-05-11 LAB — HEPATIC FUNCTION PANEL
Albumin: 3.9 g/dL (ref 3.5–5.2)
Total Protein: 6.8 g/dL (ref 6.0–8.3)

## 2011-05-16 ENCOUNTER — Telehealth: Payer: Self-pay | Admitting: *Deleted

## 2011-05-16 ENCOUNTER — Ambulatory Visit (INDEPENDENT_AMBULATORY_CARE_PROVIDER_SITE_OTHER): Payer: 59 | Admitting: Internal Medicine

## 2011-05-16 ENCOUNTER — Encounter: Payer: Self-pay | Admitting: Internal Medicine

## 2011-05-16 VITALS — BP 120/72 | HR 84 | Temp 98.2°F | Ht 63.5 in | Wt 135.8 lb

## 2011-05-16 DIAGNOSIS — Z0000 Encounter for general adult medical examination without abnormal findings: Secondary | ICD-10-CM

## 2011-05-16 NOTE — Telephone Encounter (Signed)
Message copied by Deatra James on Mon May 16, 2011  4:18 PM ------      Message from: COUSIN, SHARON T      Created: Mon May 16, 2011  3:37 PM      Regarding: PHY DATE:   05/16/12       THANKS

## 2011-05-16 NOTE — Telephone Encounter (Signed)
Received staff msg pt made cpx need labs entered in epic....05/16/11@4 :19pm/LMB

## 2011-05-16 NOTE — Patient Instructions (Signed)
It was good to see you today. Health Maintenance reviewed - all recommended immunizations and age-appropriate screenings are up-to-date. We have reviewed your prior records including labs and tests today Stay healthy and call if you have a problem Please schedule followup in 1 year for medical physical and labs, call sooner if problems.

## 2011-05-16 NOTE — Progress Notes (Signed)
  Subjective:    Patient ID: [name], female    DOB: Apr 14, 1955, 56 y.o.   MRN: [id number]  HPI patient is here today for annual physical. Patient feels well and has no complaints.  Past Medical History  Diagnosis Date  . GERD (gastroesophageal reflux disease)   . Meniere's disease     L  hearing loss related to same   Family History  Problem Relation Age of Onset  . Hypertension Mother    History  Substance Use Topics  . Smoking status: Never Smoker   . Smokeless tobacco: Never Used  . Alcohol Use: Yes     Review of Systems Constitutional: Negative for fever or weight change.  Respiratory: Negative for cough and shortness of breath.   Cardiovascular: Negative for chest pain or palpitations.  Gastrointestinal: Negative for abdominal pain, no bowel changes.  Musculoskeletal: Negative for gait problem or joint swelling.  Skin: Negative for rash.  Neurological: Negative for dizziness or headache.  No other specific complaints in a complete review of systems (except as listed in HPI above).     Objective:   Physical Exam BP 120/72  Pulse 84  Temp(Src) 98.2 F (36.8 C) (Oral)  Ht 5' 3.5" (1.613 m)  Wt 135 lb 12.8 oz (61.598 kg)  BMI 23.68 kg/m2  SpO2 98% Wt Readings from Last 3 Encounters:  05/16/11 135 lb 12.8 oz (61.598 kg)  09/16/10 136 lb 12.8 oz (62.052 kg)  03/12/10 135 lb 1.9 oz (61.289 kg)   Constitutional: She appears well-developed and well-nourished. No distress.  HENT: Head: Normocephalic and atraumatic. Ears: B TMs ok, no erythema or effusion; Nose: Nose normal. Mouth/Throat: Oropharynx is clear and moist. No oropharyngeal exudate.  Eyes: Conjunctivae and EOM are normal. Pupils are equal, round, and reactive to light. No scleral icterus.  Neck: Normal range of motion. Neck supple. No JVD present. No thyromegaly present.  Cardiovascular: Normal rate, regular rhythm and normal heart sounds.  No murmur heard. No BLE edema. Pulmonary/Chest: Effort normal  and breath sounds normal. No respiratory distress. She has no wheezes.  Abdominal: Soft. Bowel sounds are normal. She exhibits no distension. There is no tenderness. no masses Musculoskeletal: Normal range of motion, no joint effusions. No gross deformities Neurological: She is alert and oriented to person, place, and time. No cranial nerve deficit. Coordination normal.  Skin: Skin is warm and dry. No rash noted. No erythema.  Psychiatric: She has a normal mood and affect. Her behavior is normal. Judgment and thought content normal.   Lab Results  Component Value Date   WBC 6.9 05/11/2011   HGB 14.4 05/11/2011   HCT 42.1 05/11/2011   PLT 248.0 05/11/2011   GLUCOSE 85 05/11/2011   CHOL 174 05/11/2011   TRIG 83.0 05/11/2011   HDL 77.30 05/11/2011   LDLCALC 80 05/11/2011   ALT 17 05/11/2011   AST 19 05/11/2011   NA 142 05/11/2011   K 4.4 05/11/2011   CL 106 05/11/2011   CREATININE 0.7 05/11/2011   BUN 14 05/11/2011   CO2 30 05/11/2011   TSH 2.80 05/11/2011   EKG: NSR @ 65 bpm - no arrythmia or ischemic changes     Assessment & Plan:  CPX/v70.0 - Patient has been counseled on age-appropriate routine health concerns for screening and prevention. These are reviewed and up-to-date. Immunizations are up-to-date or declined. Labs and ECG reviewed.

## 2011-05-20 ENCOUNTER — Other Ambulatory Visit: Payer: Self-pay | Admitting: Obstetrics and Gynecology

## 2011-05-20 DIAGNOSIS — Z1231 Encounter for screening mammogram for malignant neoplasm of breast: Secondary | ICD-10-CM

## 2011-06-03 ENCOUNTER — Ambulatory Visit
Admission: RE | Admit: 2011-06-03 | Discharge: 2011-06-03 | Disposition: A | Discharge status: Home / Self Care | Payer: 59 | Source: Ambulatory Visit | Attending: Obstetrics and Gynecology | Admitting: Obstetrics and Gynecology

## 2011-06-03 DIAGNOSIS — Z1231 Encounter for screening mammogram for malignant neoplasm of breast: Secondary | ICD-10-CM

## 2011-06-11 LAB — HM PAP SMEAR

## 2011-08-30 ENCOUNTER — Other Ambulatory Visit: Payer: Self-pay | Admitting: Internal Medicine

## 2012-02-08 ENCOUNTER — Other Ambulatory Visit: Payer: Self-pay | Admitting: *Deleted

## 2012-02-08 MED ORDER — PANTOPRAZOLE SODIUM 40 MG PO TBEC: 40.0000 mg | DELAYED_RELEASE_TABLET | Freq: Every day | ORAL | Status: DC

## 2012-02-08 NOTE — Telephone Encounter (Signed)
Received fax concerning changing nexium to pantoprazole to reduce pt out-of pocket cost. MD ok sending rx for pantoprazole...Raechel Chute

## 2012-05-14 ENCOUNTER — Ambulatory Visit (INDEPENDENT_AMBULATORY_CARE_PROVIDER_SITE_OTHER): Payer: 59

## 2012-05-14 DIAGNOSIS — Z0000 Encounter for general adult medical examination without abnormal findings: Secondary | ICD-10-CM

## 2012-05-14 LAB — URINALYSIS, ROUTINE W REFLEX MICROSCOPIC
Ketones, ur: NEGATIVE
Specific Gravity, Urine: 1.02 (ref 1.000–1.030)
Urine Glucose: NEGATIVE
pH: 6 (ref 5.0–8.0)

## 2012-05-14 LAB — CBC WITH DIFFERENTIAL/PLATELET
Basophils Absolute: 0 10*3/uL (ref 0.0–0.1)
Basophils Relative: 0.7 % (ref 0.0–3.0)
Eosinophils Absolute: 0.2 10*3/uL (ref 0.0–0.7)
HCT: 41.6 % (ref 36.0–46.0)
Hemoglobin: 14.4 g/dL (ref 12.0–15.0)
Lymphs Abs: 2.6 10*3/uL (ref 0.7–4.0)
MCHC: 34.5 g/dL (ref 30.0–36.0)
MCV: 92.5 fl (ref 78.0–100.0)
Neutro Abs: 3.3 10*3/uL (ref 1.4–7.7)
RBC: 4.5 Mil/uL (ref 3.87–5.11)
RDW: 13.3 % (ref 11.5–14.6)

## 2012-05-15 LAB — TSH: TSH: 2.55 u[IU]/mL (ref 0.35–5.50)

## 2012-05-15 LAB — LIPID PANEL
Cholesterol: 218 mg/dL — ABNORMAL HIGH (ref 0–200)
VLDL: 40.8 mg/dL — ABNORMAL HIGH (ref 0.0–40.0)

## 2012-05-15 LAB — HEPATIC FUNCTION PANEL
Alkaline Phosphatase: 47 U/L (ref 39–117)
Bilirubin, Direct: 0 mg/dL (ref 0.0–0.3)

## 2012-05-15 LAB — BASIC METABOLIC PANEL
CO2: 24 mEq/L (ref 19–32)
Chloride: 107 mEq/L (ref 96–112)
Glucose, Bld: 75 mg/dL (ref 70–99)
Potassium: 4.9 mEq/L (ref 3.5–5.1)
Sodium: 141 mEq/L (ref 135–145)

## 2012-05-16 ENCOUNTER — Ambulatory Visit (INDEPENDENT_AMBULATORY_CARE_PROVIDER_SITE_OTHER): Payer: 59 | Admitting: Internal Medicine

## 2012-05-16 ENCOUNTER — Encounter: Payer: Self-pay | Admitting: Internal Medicine

## 2012-05-16 VITALS — BP 130/82 | HR 105 | Temp 98.2°F | Ht 63.0 in | Wt 141.0 lb

## 2012-05-16 DIAGNOSIS — Z0000 Encounter for general adult medical examination without abnormal findings: Secondary | ICD-10-CM

## 2012-05-16 DIAGNOSIS — Z1239 Encounter for other screening for malignant neoplasm of breast: Secondary | ICD-10-CM

## 2012-05-16 NOTE — Patient Instructions (Signed)
It was good to see you today. We have reviewed your prior records including labs and tests today Health Maintenance reviewed - all recommended immunizations and age-appropriate screenings are up-to-date.  Will refer for your mammogram at [facility] as discussed - follow up with gynecology every other year and/or as needed for problems Work on lifestyle changes as discussed (low carb, increased protein diet; improved exercise efforts; weight loss) to control sugar, blood pressure and cholesterol levels and/or reduce risk of developing other medical problems. Look into [email or web address] or other type of food journal to assist you in this process. Please schedule followup in 12 months for annual medical physical and labs, call sooner if problems.   Health Maintenance, Females A healthy lifestyle and preventative care can promote health and wellness.  Maintain regular health, dental, and eye exams.  Eat a healthy diet. Foods like vegetables, fruits, whole grains, low-fat dairy products, and lean protein foods contain the nutrients you need without too many calories. Decrease your intake of foods high in solid fats, added sugars, and salt. Get information about a proper diet from your caregiver, if necessary.  Regular physical exercise is one of the most important things you can do for your health. Most adults should get at least 150 minutes of moderate-intensity exercise (any activity that increases your heart rate and causes you to sweat) each week. In addition, most adults need muscle-strengthening exercises on 2 or more days a week.   Maintain a healthy weight. The body mass index (BMI) is a screening tool to identify possible weight problems. It provides an estimate of body fat based on height and weight. Your caregiver can help determine your BMI, and can help you achieve or maintain a healthy weight. For adults 20 years and older:  A BMI below 18.5 is considered underweight.  A BMI of 18.5  to 24.9 is normal.  A BMI of 25 to 29.9 is considered overweight.  A BMI of 30 and above is considered obese.  Maintain normal blood lipids and cholesterol by exercising and minimizing your intake of saturated fat. Eat a balanced diet with plenty of fruits and vegetables. Blood tests for lipids and cholesterol should begin at age 93 and be repeated every 5 years. If your lipid or cholesterol levels are high, you are over 50, or you are a high risk for heart disease, you may need your cholesterol levels checked more frequently.Ongoing high lipid and cholesterol levels should be treated with medicines if diet and exercise are not effective.  If you smoke, find out from your caregiver how to quit. If you do not use tobacco, do not start.  If you are pregnant, do not drink alcohol. If you are breastfeeding, be very cautious about drinking alcohol. If you are not pregnant and choose to drink alcohol, do not exceed 1 drink per day. One drink is considered to be 12 ounces (355 mL) of beer, 5 ounces (148 mL) of wine, or 1.5 ounces (44 mL) of liquor.  Avoid use of street drugs. Do not share needles with anyone. Ask for help if you need support or instructions about stopping the use of drugs.  High blood pressure causes heart disease and increases the risk of stroke. Blood pressure should be checked at least every 1 to 2 years. Ongoing high blood pressure should be treated with medicines, if weight loss and exercise are not effective.  If you are 53 to 58 years old, ask your caregiver if you should take aspirin to  prevent strokes.  Diabetes screening involves taking a blood sample to check your fasting blood sugar level. This should be done once every 3 years, after age 32, if you are within normal weight and without risk factors for diabetes. Testing should be considered at a younger age or be carried out more frequently if you are overweight and have at least 1 risk factor for diabetes.  Breast cancer  screening is essential preventative care for women. You should practice "breast self-awareness." This means understanding the normal appearance and feel of your breasts and may include breast self-examination. Any changes detected, no matter how small, should be reported to a caregiver. Women in their 28s and 30s should have a clinical breast exam (CBE) by a caregiver as part of a regular health exam every 1 to 3 years. After age 9, women should have a CBE every year. Starting at age 84, women should consider having a mammogram (breast X-ray) every year. Women who have a family history of breast cancer should talk to their caregiver about genetic screening. Women at a high risk of breast cancer should talk to their caregiver about having an MRI and a mammogram every year.  The Pap test is a screening test for cervical cancer. Women should have a Pap test starting at age 70. Between ages 62 and 70, Pap tests should be repeated every 2 years. Beginning at age 39, you should have a Pap test every 3 years as long as the past 3 Pap tests have been normal. If you had a hysterectomy for a problem that was not cancer or a condition that could lead to cancer, then you no longer need Pap tests. If you are between ages 33 and 7, and you have had normal Pap tests going back 10 years, you no longer need Pap tests. If you have had past treatment for cervical cancer or a condition that could lead to cancer, you need Pap tests and screening for cancer for at least 20 years after your treatment. If Pap tests have been discontinued, risk factors (such as a new sexual partner) need to be reassessed to determine if screening should be resumed. Some women have medical problems that increase the chance of getting cervical cancer. In these cases, your caregiver may recommend more frequent screening and Pap tests.  The human papillomavirus (HPV) test is an additional test that may be used for cervical cancer screening. The HPV test  looks for the virus that can cause the cell changes on the cervix. The cells collected during the Pap test can be tested for HPV. The HPV test could be used to screen women aged 42 years and older, and should be used in women of any age who have unclear Pap test results. After the age of 32, women should have HPV testing at the same frequency as a Pap test.  Colorectal cancer can be detected and often prevented. Most routine colorectal cancer screening begins at the age of 44 and continues through age 10. However, your caregiver may recommend screening at an earlier age if you have risk factors for colon cancer. On a yearly basis, your caregiver may provide home test kits to check for hidden blood in the stool. Use of a small camera at the end of a tube, to directly examine the colon (sigmoidoscopy or colonoscopy), can detect the earliest forms of colorectal cancer. Talk to your caregiver about this at age 70, when routine screening begins. Direct examination of the colon should be  repeated every 5 to 10 years through age 40, unless early forms of pre-cancerous polyps or small growths are found.  Hepatitis C blood testing is recommended for all people born from [birth year] through 1966 and any individual with known risks for hepatitis C.  Practice safe sex. Use condoms and avoid high-risk sexual practices to reduce the spread of sexually transmitted infections (STIs). Sexually active women aged 16 and younger should be checked for Chlamydia, which is a common sexually transmitted infection. Older women with new or multiple partners should also be tested for Chlamydia. Testing for other STIs is recommended if you are sexually active and at increased risk.  Osteoporosis is a disease in which the bones lose minerals and strength with aging. This can result in serious bone fractures. The risk of osteoporosis can be identified using a bone density scan. Women ages 66 and over and women at risk for fractures or  osteoporosis should discuss screening with their caregivers. Ask your caregiver whether you should be taking a calcium supplement or vitamin D to reduce the rate of osteoporosis.  Menopause can be associated with physical symptoms and risks. Hormone replacement therapy is available to decrease symptoms and risks. You should talk to your caregiver about whether hormone replacement therapy is right for you.  Use sunscreen with a sun protection factor (SPF) of 30 or greater. Apply sunscreen liberally and repeatedly throughout the day. You should seek shade when your shadow is shorter than you. Protect yourself by wearing long sleeves, pants, a wide-brimmed hat, and sunglasses year round, whenever you are outdoors.  Notify your caregiver of new moles or changes in moles, especially if there is a change in shape or color. Also notify your caregiver if a mole is larger than the size of a pencil eraser.  Stay current with your immunizations. Document Released: 07/12/2010 Document Revised: 03/21/2011 Document Reviewed: 07/12/2010 [facility] Patient Information 2013 [place], [place].

## 2012-05-16 NOTE — Progress Notes (Signed)
Subjective:    Patient ID: [name], female    DOB: 05/31/1955, 57 y.o.   MRN: [id number]  HPI  patient is here today for annual physical. Patient feels well and has no complaints.  Past Medical History  Diagnosis Date  . GERD (gastroesophageal reflux disease)   . Meniere's disease     L  hearing loss related to same   Family History  Problem Relation Age of Onset  . Hypertension Mother   . Stroke Father   . Arthritis      shoulder replacement  . Atrial fibrillation Mother   . Deep vein thrombosis Mother   . Osteoporosis Mother     compression fx   History  Substance Use Topics  . Smoking status: Never Smoker   . Smokeless tobacco: Never Used  . Alcohol Use: Yes     Review of Systems  Constitutional: Negative for fever or weight change.  Respiratory: Negative for cough and shortness of breath.   Cardiovascular: Negative for chest pain or palpitations.  Gastrointestinal: Negative for abdominal pain, no bowel changes.  Musculoskeletal: Negative for gait problem or joint swelling.  Skin: Negative for rash.  Neurological: Negative for dizziness or headache.  No other specific complaints in a complete review of systems (except as listed in HPI above).     Objective:   Physical Exam  BP 130/82  Pulse 105  Temp(Src) 98.2 F (36.8 C) (Oral)  Ht 5\' 3"  (1.6 m)  Wt 141 lb (63.957 kg)  BMI 24.98 kg/m2  SpO2 98% Wt Readings from Last 3 Encounters:  05/16/12 141 lb (63.957 kg)  05/16/11 135 lb 12.8 oz (61.598 kg)  09/16/10 136 lb 12.8 oz (62.052 kg)   Constitutional: She appears well-developed and well-nourished. No distress.  HENT: Head: Normocephalic and atraumatic. Ears: B TMs ok, no erythema or effusion; Nose: Nose normal. Mouth/Throat: Oropharynx is clear and moist. No oropharyngeal exudate.  Eyes: Conjunctivae and EOM are normal. Pupils are equal, round, and reactive to light. No scleral icterus.  Neck: Normal range of motion. Neck supple. No JVD present. No  thyromegaly present.  Cardiovascular: Normal rate, regular rhythm and normal heart sounds.  No murmur heard. No BLE edema. Pulmonary/Chest: Effort normal and breath sounds normal. No respiratory distress. She has no wheezes.  Abdominal: Soft. Bowel sounds are normal. She exhibits no distension. There is no tenderness. no masses Musculoskeletal: Normal range of motion, no joint effusions. No gross deformities Neurological: She is alert and oriented to person, place, and time. No cranial nerve deficit. Coordination normal.  Skin: Skin is warm and dry. No rash noted. No erythema.  Psychiatric: She has a normal mood and affect. Her behavior is normal. Judgment and thought content normal.   Lab Results  Component Value Date   WBC 6.8 05/14/2012   HGB 14.4 05/14/2012   HCT 41.6 05/14/2012   PLT 229.0 05/14/2012   GLUCOSE 75 05/14/2012   CHOL 218* 05/14/2012   TRIG 204.0* 05/14/2012   HDL 87.30 05/14/2012   LDLDIRECT 107.4 05/14/2012   LDLCALC 80 05/11/2011   ALT 18 05/14/2012   AST 20 05/14/2012   NA 141 05/14/2012   K 4.9 05/14/2012   CL 107 05/14/2012   CREATININE 0.8 05/14/2012   BUN 13 05/14/2012   CO2 24 05/14/2012   TSH 2.55 05/14/2012   EKG: NSR @ 96 bpm - no arrythmia or ischemic changes     Assessment & Plan:  CPX/v70.0 - Patient has been counseled on  age-appropriate routine health concerns for screening and prevention. These are reviewed and up-to-date. Immunizations are up-to-date or declined. Labs and ECG reviewed.

## 2012-06-25 ENCOUNTER — Ambulatory Visit: Payer: 59

## 2012-07-06 ENCOUNTER — Encounter: Payer: Self-pay | Admitting: *Deleted

## 2012-07-19 ENCOUNTER — Ambulatory Visit: Payer: Self-pay | Admitting: Nurse Practitioner

## 2012-07-23 ENCOUNTER — Ambulatory Visit
Admission: RE | Admit: 2012-07-23 | Discharge: 2012-07-23 | Disposition: A | Discharge status: Home / Self Care | Payer: 59 | Source: Ambulatory Visit | Attending: Internal Medicine | Admitting: Internal Medicine

## 2012-07-23 DIAGNOSIS — Z1239 Encounter for other screening for malignant neoplasm of breast: Secondary | ICD-10-CM

## 2012-07-26 ENCOUNTER — Other Ambulatory Visit: Payer: Self-pay | Admitting: Internal Medicine

## 2012-07-26 DIAGNOSIS — R928 Other abnormal and inconclusive findings on diagnostic imaging of breast: Secondary | ICD-10-CM

## 2012-08-24 ENCOUNTER — Encounter: Payer: Self-pay | Admitting: Nurse Practitioner

## 2012-08-24 ENCOUNTER — Ambulatory Visit (INDEPENDENT_AMBULATORY_CARE_PROVIDER_SITE_OTHER): Payer: 59 | Admitting: Nurse Practitioner

## 2012-08-24 VITALS — BP 110/68 | HR 64 | Resp 12 | Ht 63.75 in | Wt 138.8 lb

## 2012-08-24 DIAGNOSIS — M810 Age-related osteoporosis without current pathological fracture: Secondary | ICD-10-CM | POA: Insufficient documentation

## 2012-08-24 DIAGNOSIS — Z01419 Encounter for gynecological examination (general) (routine) without abnormal findings: Secondary | ICD-10-CM

## 2012-08-24 NOTE — Patient Instructions (Signed)

## 2012-08-24 NOTE — Progress Notes (Signed)
57 y.o. G0P0 Divorced Caucasian Fe here for annual exam. No vaginal bleeding. Same partner for 9 years.  Patient's last menstrual period was 01/10/2006.          Sexually active: yes  The current method of family planning is tubal ligation.    Exercising: no  The patient does not participate in regular exercise at present. Smoker:  no  Health Maintenance: Pap:  07/18/2011 normal with Negative HR HPV MMG: 07/23/2012 needs further evaluation for possible mass on left scheduled for Tuesday 8/26 Colonoscopy:  12/12/2005 recheck in 10 years BMD:   05/2009  -1.1/-2.7 TDaP:  Within last 10 years, per pt.  Labs: PCP maintains all labs and urine.    reports that she has never smoked. She has never used smokeless tobacco. She reports that  drinks alcohol. She reports that she does not use illicit drugs.  Past Medical History  Diagnosis Date  . GERD (gastroesophageal reflux disease)   . Meniere's disease     L  hearing loss related to same  . PMB (postmenopausal bleeding) 12/07    proliferative endo. No HRT, PUS normal    Past Surgical History  Procedure Laterality Date  . Tubal ligation    . Irrigation and debridement sebaceous cyst  1980 & 1985  . Abscess drainage  09/2010    suprapubic  . Btsp  1994  . Cystectomy      from buttock  . Colonoscopy w/ biopsies  12/12/05    hyperplastic polyp, recheck in 10 years    Current Outpatient Prescriptions  Medication Sig Dispense Refill  . Multiple Vitamins-Minerals (MULTIVITAMIN WITH MINERALS) tablet Take 1 tablet by mouth daily.        . pantoprazole (PROTONIX) 40 MG tablet Take 1 tablet (40 mg total) by mouth daily.  90 tablet  3   No current facility-administered medications for this visit.    Family History  Problem Relation Age of Onset  . Hypertension Mother   . Atrial fibrillation Mother   . Deep vein thrombosis Mother   . Osteoporosis Mother     compression fx  . Thyroid disease Mother   . Osteoarthritis Mother   . Stroke  Father   . Arthritis Mother     shoulder replacement  . Anorexia nervosa Sister   . Osteoporosis Sister   . Thyroid disease Sister   . Diabetes Brother     ROS:  Pertinent items are noted in HPI.  Otherwise, a comprehensive ROS was negative.  Exam:   BP 110/68  Pulse 64  Resp 12  Ht 5' 3.75" (1.619 m)  Wt 138 lb 12.8 oz (62.959 kg)  BMI 24.02 kg/m2  LMP 01/10/2006 Height: 5' 3.75" (161.9 cm)  Ht Readings from Last 3 Encounters:  08/24/12 5' 3.75" (1.619 m)  05/16/12 5\' 3"  (1.6 m)  05/16/11 5' 3.5" (1.613 m)    General appearance: alert, cooperative and appears stated age Head: Normocephalic, without obvious abnormality, atraumatic Neck: no adenopathy, supple, symmetrical, trachea midline and thyroid normal to inspection and palpation Lungs: clear to auscultation bilaterally Breasts: normal appearance, no masses or tenderness no specific mass but has cluster of FCB in upper left breast. Heart: regular rate and rhythm Abdomen: soft, non-tender; no masses,  no organomegaly Extremities: extremities normal, atraumatic, no cyanosis or edema Skin: Skin color, texture, turgor normal. No rashes or lesions Lymph nodes: Cervical, supraclavicular, and axillary nodes normal. No abnormal inguinal nodes palpated Neurologic: Grossly normal   Pelvic: External genitalia:  no lesions              Urethra:  normal appearing urethra with no masses, tenderness or lesions              Bartholin's and Skene's: normal                 Vagina: normal appearing vagina with normal color and discharge, no lesions              Cervix: anteverted              Pap taken: no Bimanual Exam:  Uterus:  normal size, contour, position, consistency, mobility, non-tender              Adnexa: no mass, fullness, tenderness               Rectovaginal: Confirms               Anus:  normal sphincter tone, no lesions  A:  Well Woman with normal exam  Postmenopausal - no HRT  Recent abnormal  mammogram  Osteoporosis - conservative management - does not want bisphosphonate's  P:   Pap smear as per guidelines   Mammogram scheduled 09/04/12  Counseled on breast self exam, adequate intake of calcium and vitamin D, diet and exercise return annually or prn  An After Visit Summary was printed and given to the patient.

## 2012-08-27 NOTE — Progress Notes (Signed)
Encounter reviewed by Dr. Brook Silva.  

## 2012-09-04 ENCOUNTER — Ambulatory Visit
Admission: RE | Admit: 2012-09-04 | Discharge: 2012-09-04 | Disposition: A | Discharge status: Home / Self Care | Payer: 59 | Source: Ambulatory Visit | Attending: Internal Medicine | Admitting: Internal Medicine

## 2012-09-04 DIAGNOSIS — R928 Other abnormal and inconclusive findings on diagnostic imaging of breast: Secondary | ICD-10-CM

## 2013-08-01 ENCOUNTER — Other Ambulatory Visit: Payer: Self-pay

## 2013-08-01 DIAGNOSIS — Z1231 Encounter for screening mammogram for malignant neoplasm of breast: Secondary | ICD-10-CM

## 2013-08-16 ENCOUNTER — Ambulatory Visit
Admission: RE | Admit: 2013-08-16 | Discharge: 2013-08-16 | Disposition: A | Discharge status: Home / Self Care | Payer: 59 | Source: Ambulatory Visit

## 2013-08-16 DIAGNOSIS — Z1231 Encounter for screening mammogram for malignant neoplasm of breast: Secondary | ICD-10-CM

## 2013-08-27 ENCOUNTER — Ambulatory Visit: Payer: 59 | Admitting: Nurse Practitioner

## 2013-08-27 ENCOUNTER — Other Ambulatory Visit: Payer: Self-pay | Admitting: Ophthalmology

## 2013-09-19 ENCOUNTER — Encounter: Payer: Self-pay | Admitting: Nurse Practitioner

## 2013-09-19 ENCOUNTER — Ambulatory Visit (INDEPENDENT_AMBULATORY_CARE_PROVIDER_SITE_OTHER): Payer: 59 | Admitting: Nurse Practitioner

## 2013-09-19 VITALS — BP 120/80 | HR 68 | Resp 18 | Ht 63.75 in | Wt 131.0 lb

## 2013-09-19 DIAGNOSIS — Z01419 Encounter for gynecological examination (general) (routine) without abnormal findings: Secondary | ICD-10-CM

## 2013-09-19 DIAGNOSIS — Z1211 Encounter for screening for malignant neoplasm of colon: Secondary | ICD-10-CM

## 2013-09-19 DIAGNOSIS — M810 Age-related osteoporosis without current pathological fracture: Secondary | ICD-10-CM

## 2013-09-19 NOTE — Patient Instructions (Signed)

## 2013-09-19 NOTE — Progress Notes (Signed)
58 y.o. G0P0 Single Caucasian Fe here for annual exam.  Slight vaso symptoms.  Sleep is good.  Same partner for 12 years.  Timmothy Sours has already retired.  Patient's last menstrual period was 01/10/2006.          Sexually active: Yes.    The current method of family planning is tubal ligation.    Exercising: Yes.    Bike rides 4 x weekly Smoker:  no  Health Maintenance: Pap: 07/2011 Neg. HR HPV neg MMG: 08/2013 BIRADS1: Neg Colonoscopy: 12/2005- hyperplastic polyp - recheck in 10 years IFOB BMD:  2011 TDaP:  2008 Labs: PCP   reports that she has never smoked. She has never used smokeless tobacco. She reports that she drinks alcohol. She reports that she does not use illicit drugs.  Past Medical History  Diagnosis Date  . GERD (gastroesophageal reflux disease)   . Meniere's disease     L  hearing loss related to same  . PMB (postmenopausal bleeding) 12/07    proliferative endo. No HRT, PUS normal    Past Surgical History  Procedure Laterality Date  . Tubal ligation    . Irrigation and debridement sebaceous cyst  1980 & 1985  . Abscess drainage  09/2010    suprapubic  . Btsp  1994  . Cystectomy      from buttock  . Colonoscopy w/ biopsies  12/12/05    hyperplastic polyp, recheck in 10 years    Current Outpatient Prescriptions  Medication Sig Dispense Refill  . Multiple Vitamins-Minerals (MULTIVITAMIN WITH MINERALS) tablet Take 1 tablet by mouth daily.        . pantoprazole (PROTONIX) 40 MG tablet Take 1 tablet (40 mg total) by mouth daily.  90 tablet  3   No current facility-administered medications for this visit.    Family History  Problem Relation Age of Onset  . Hypertension Mother   . Atrial fibrillation Mother   . Deep vein thrombosis Mother   . Osteoporosis Mother     compression fx  . Thyroid disease Mother   . Osteoarthritis Mother   . Stroke Father   . Arthritis Mother     shoulder replacement  . Anorexia nervosa Sister   . Osteoporosis Sister   . Thyroid  disease Sister   . Diabetes Brother     ROS:  Pertinent items are noted in HPI.  Otherwise, a comprehensive ROS was negative.  Exam:   BP 120/80  Pulse 68  Resp 18  Ht 5' 3.75" (1.619 m)  Wt 131 lb (59.421 kg)  BMI 22.67 kg/m2  LMP 01/10/2006 Height: 5' 3.75" (161.9 cm)  Ht Readings from Last 3 Encounters:  09/19/13 5' 3.75" (1.619 m)  08/24/12 5' 3.75" (1.619 m)  05/16/12 5\' 3"  (1.6 m)    General appearance: alert, cooperative and appears stated age Head: Normocephalic, without obvious abnormality, atraumatic Neck: no adenopathy, supple, symmetrical, trachea midline and thyroid normal to inspection and palpation Lungs: clear to auscultation bilaterally Breasts: normal appearance, no masses or tenderness Heart: regular rate and rhythm Abdomen: soft, non-tender; no masses,  no organomegaly Extremities: extremities normal, atraumatic, no cyanosis or edema Skin: Skin color, texture, turgor normal. No rashes or lesions Lymph nodes: Cervical, supraclavicular, and axillary nodes normal. No abnormal inguinal nodes palpated Neurologic: Grossly normal   Pelvic: External genitalia:  no lesions              Urethra:  normal appearing urethra with no masses, tenderness or lesions  Bartholin's and Skene's: normal                 Vagina: normal appearing vagina with normal color and discharge, no lesions              Cervix: anteverted              Pap taken: No. Bimanual Exam:  Uterus:  normal size, contour, position, consistency, mobility, non-tender              Adnexa: no mass, fullness, tenderness               Rectovaginal: Confirms               Anus:  normal sphincter tone, no lesions  A:  Well Woman with normal exam  Postmenopausal - no HRT    Osteoporosis - conservative management - does not want bisphosphonate's  GERD   P:   Reviewed health and wellness pertinent to exam  Pap smear taken not  today  Mammogram is due 8/16  Repeat BMD for stability  IFOB  is given  Counseled on breast self exam, mammography screening, adequate intake of calcium and vitamin D, diet and exercise, Kegel's exercises return annually or prn  An After Visit Summary was printed and given to the patient.

## 2013-09-22 ENCOUNTER — Encounter: Payer: Self-pay | Admitting: Nurse Practitioner

## 2013-09-22 NOTE — Progress Notes (Signed)
Encounter reviewed by Dr. Brook Silva.  

## 2013-09-25 ENCOUNTER — Encounter (HOSPITAL_COMMUNITY): Payer: Self-pay | Admitting: Pharmacy Technician

## 2013-09-26 ENCOUNTER — Encounter (HOSPITAL_COMMUNITY)
Admission: RE | Admit: 2013-09-26 | Discharge: 2013-09-26 | Disposition: A | Discharge status: Home / Self Care | Payer: 59 | Source: Ambulatory Visit | Attending: Ophthalmology | Admitting: Ophthalmology

## 2013-09-26 ENCOUNTER — Encounter (HOSPITAL_COMMUNITY): Payer: Self-pay

## 2013-09-26 DIAGNOSIS — Z01812 Encounter for preprocedural laboratory examination: Secondary | ICD-10-CM | POA: Insufficient documentation

## 2013-09-26 DIAGNOSIS — H43399 Other vitreous opacities, unspecified eye: Secondary | ICD-10-CM | POA: Diagnosis not present

## 2013-09-26 HISTORY — DX: Personal history of other specified conditions: Z87.898

## 2013-09-26 HISTORY — DX: Personal history of colonic polyps: Z86.010

## 2013-09-26 HISTORY — DX: Personal history of other diseases of the respiratory system: Z87.09

## 2013-09-26 HISTORY — DX: Personal history of other diseases of the digestive system: Z87.19

## 2013-09-26 HISTORY — DX: Pain in unspecified joint: M25.50

## 2013-09-26 HISTORY — DX: Personal history of colon polyps, unspecified: Z86.0100

## 2013-09-26 LAB — CBC
HEMATOCRIT: 39.9 % (ref 36.0–46.0)
Hemoglobin: 13.6 g/dL (ref 12.0–15.0)
MCH: 31.2 pg (ref 26.0–34.0)
MCHC: 34.1 g/dL (ref 30.0–36.0)
MCV: 91.5 fL (ref 78.0–100.0)
PLATELETS: 221 10*3/uL (ref 150–400)
RBC: 4.36 MIL/uL (ref 3.87–5.11)
RDW: 12.3 % (ref 11.5–15.5)
WBC: 7.8 10*3/uL (ref 4.0–10.5)

## 2013-09-26 NOTE — Progress Notes (Addendum)
Medical Md is Dr.Valerie Leschber  Pt doesn't have a cardiologist  Denies ever having a heart cath/stress test/echo  EKG was done at yearly physical-to be requested from [place]  Denies CXR in past yr

## 2013-09-26 NOTE — Pre-Procedure Instructions (Signed)
[name]  09/26/2013   Your procedure is scheduled on:  Mon, Sept 28 @ 12:45 PM  Report to [facility] at 10:45 AM.  Call this number if you have problems the morning of surgery: [phone]   Remember:   Do not eat food or drink liquids after midnight.   Take these medicines the morning of surgery with A SIP OF WATER: Omeprazole(Prilsosec) and Protonix(Pantoprazole)                No Goody's,BC's,Aleve,Aspirin,Ibuprofen,Fish Oil,or any Herbal Medications   Do not wear jewelry, make-up or nail polish.  Do not wear lotions, powders, or perfumes. You may wear deodorant.  Do not shave 48 hours prior to surgery.   Do not bring valuables to the hospital.  [facility] is not responsible                  for any belongings or valuables.               Contacts, dentures or bridgework may not be worn into surgery.  Leave suitcase in the car. After surgery it may be brought to your room.  For patients admitted to the hospital, discharge time is determined by your                treatment team.               Patients discharged the day of surgery will not be allowed to drive  home.    Special Instructions:   - Preparing for Surgery  Before surgery, you can play an important role.  Because skin is not sterile, your skin needs to be as free of germs as possible.  You can reduce the number of germs on you skin by washing with CHG (chlorahexidine gluconate) soap before surgery.  CHG is an antiseptic cleaner which kills germs and bonds with the skin to continue killing germs even after washing.  Please DO NOT use if you have an allergy to CHG or antibacterial soaps.  If your skin becomes reddened/irritated stop using the CHG and inform your nurse when you arrive at Short Stay.  Do not shave (including legs and underarms) for at least 48 hours prior to the first CHG shower.  You may shave your face.  Please follow these instructions carefully:   1.  Shower with CHG Soap  the night before surgery and the                                morning of Surgery.  2.  If you choose to wash your hair, wash your hair first as usual with your       normal shampoo.  3.  After you shampoo, rinse your hair and body thoroughly to remove the                      Shampoo.  4.  Use CHG as you would any other liquid soap.  You can apply chg directly       to the skin and wash gently with scrungie or a clean washcloth.  5.  Apply the CHG Soap to your body ONLY FROM THE NECK DOWN.        Do not use on open wounds or open sores.  Avoid contact with your eyes,       ears, mouth and genitals (private  parts).  Wash genitals (private parts)       with your normal soap.  6.  Wash thoroughly, paying special attention to the area where your surgery        will be performed.  7.  Thoroughly rinse your body with warm water from the neck down.  8.  DO NOT shower/wash with your normal soap after using and rinsing off       the CHG Soap.  9.  Pat yourself dry with a clean towel.            10.  Wear clean pajamas.            11.  Place clean sheets on your bed the night of your first shower and do not        sleep with pets.  Day of Surgery  Do not apply any lotions/deoderants the morning of surgery.  Please wear clean clothes to the hospital/surgery center.     Please read over the following fact sheets that you were given: Pain Booklet, Coughing and Deep Breathing and Surgical Site Infection Prevention

## 2013-10-07 ENCOUNTER — Encounter (HOSPITAL_COMMUNITY): Payer: 59 | Admitting: Anesthesiology

## 2013-10-07 ENCOUNTER — Ambulatory Visit (HOSPITAL_COMMUNITY)
Admission: RE | Admit: 2013-10-07 | Discharge: 2013-10-07 | Disposition: A | Discharge status: Home / Self Care | Payer: 59 | Source: Ambulatory Visit | Attending: Ophthalmology | Admitting: Ophthalmology

## 2013-10-07 ENCOUNTER — Encounter (HOSPITAL_COMMUNITY)
Admission: RE | Disposition: A | Discharge status: Home / Self Care | Payer: Self-pay | Source: Ambulatory Visit | Attending: Ophthalmology

## 2013-10-07 ENCOUNTER — Other Ambulatory Visit: Payer: Self-pay | Admitting: Ophthalmology

## 2013-10-07 ENCOUNTER — Ambulatory Visit (HOSPITAL_COMMUNITY): Payer: 59 | Admitting: Anesthesiology

## 2013-10-07 ENCOUNTER — Encounter (HOSPITAL_COMMUNITY): Payer: Self-pay | Admitting: *Deleted

## 2013-10-07 DIAGNOSIS — H43399 Other vitreous opacities, unspecified eye: Secondary | ICD-10-CM | POA: Diagnosis not present

## 2013-10-07 HISTORY — PX: PARS PLANA VITRECTOMY: SHX2166

## 2013-10-07 SURGERY — PARS PLANA VITRECTOMY WITH 25 GAUGE
Anesthesia: Monitor Anesthesia Care | Site: Eye | Laterality: Right

## 2013-10-07 MED ORDER — ONDANSETRON HCL 4 MG/2ML IJ SOLN
INTRAMUSCULAR | Status: AC
  Filled 2013-10-07: qty 2

## 2013-10-07 MED ORDER — EPINEPHRINE HCL 1 MG/ML IJ SOLN
INTRAOCULAR | Status: DC | PRN
  Administered 2013-10-07: 13:00:00

## 2013-10-07 MED ORDER — ATROPINE SULFATE 1 % OP SOLN
1.0000 [drp] | OPHTHALMIC | Status: AC
  Administered 2013-10-07 (×3): 1 [drp] via OPHTHALMIC

## 2013-10-07 MED ORDER — ATROPINE SULFATE 1 % OP SOLN
OPHTHALMIC | Status: AC
  Filled 2013-10-07: qty 2

## 2013-10-07 MED ORDER — LACTATED RINGERS IV SOLN: INTRAVENOUS | Status: DC | PRN

## 2013-10-07 MED ORDER — HYPROMELLOSE (GONIOSCOPIC) 2.5 % OP SOLN
OPHTHALMIC | Status: AC
  Filled 2013-10-07: qty 15

## 2013-10-07 MED ORDER — EPHEDRINE SULFATE 50 MG/ML IJ SOLN
INTRAMUSCULAR | Status: AC
  Filled 2013-10-07: qty 1

## 2013-10-07 MED ORDER — HYPROMELLOSE (GONIOSCOPIC) 2.5 % OP SOLN
OPHTHALMIC | Status: DC | PRN
  Administered 2013-10-07: 1 [drp] via OPHTHALMIC

## 2013-10-07 MED ORDER — CEFAZOLIN SUBCONJUNCTIVAL INJECTION 100 MG/0.5 ML
200.0000 mg | INJECTION | SUBCONJUNCTIVAL | Status: AC
  Administered 2013-10-07: 200 mg via SUBCONJUNCTIVAL
  Filled 2013-10-07: qty 5

## 2013-10-07 MED ORDER — LACTATED RINGERS IV SOLN: INTRAVENOUS | Status: DC

## 2013-10-07 MED ORDER — ONDANSETRON HCL 4 MG/2ML IJ SOLN
INTRAMUSCULAR | Status: DC | PRN
  Administered 2013-10-07: 4 mg via INTRAVENOUS

## 2013-10-07 MED ORDER — FENTANYL CITRATE 0.05 MG/ML IJ SOLN
INTRAMUSCULAR | Status: AC
  Filled 2013-10-07: qty 5

## 2013-10-07 MED ORDER — BSS IO SOLN
INTRAOCULAR | Status: AC
Start: 2013-10-07 — End: 2013-10-07
  Filled 2013-10-07: qty 15

## 2013-10-07 MED ORDER — FENTANYL CITRATE 0.05 MG/ML IJ SOLN
INTRAMUSCULAR | Status: DC | PRN
  Administered 2013-10-07 (×2): 50 ug via INTRAVENOUS

## 2013-10-07 MED ORDER — PROPOFOL 10 MG/ML IV BOLUS
INTRAVENOUS | Status: DC | PRN
  Administered 2013-10-07: 30 mg via INTRAVENOUS
  Administered 2013-10-07: 40 mg via INTRAVENOUS

## 2013-10-07 MED ORDER — DEXAMETHASONE SODIUM PHOSPHATE 10 MG/ML IJ SOLN
INTRAMUSCULAR | Status: AC
  Filled 2013-10-07: qty 1

## 2013-10-07 MED ORDER — DEXAMETHASONE SODIUM PHOSPHATE 10 MG/ML IJ SOLN
INTRAMUSCULAR | Status: DC | PRN
  Administered 2013-10-07: 10 mg

## 2013-10-07 MED ORDER — LIDOCAINE HCL (CARDIAC) 20 MG/ML IV SOLN
INTRAVENOUS | Status: AC
  Filled 2013-10-07: qty 5

## 2013-10-07 MED ORDER — SODIUM CHLORIDE 0.9 % IV SOLN
INTRAVENOUS | Status: DC
  Administered 2013-10-07: 11:00:00 via INTRAVENOUS

## 2013-10-07 MED ORDER — SODIUM CHLORIDE 0.9 % IV SOLN
INTRAVENOUS | Status: DC | PRN
  Administered 2013-10-07: 13:00:00 via INTRAVENOUS

## 2013-10-07 MED ORDER — PROPOFOL 10 MG/ML IV BOLUS
INTRAVENOUS | Status: AC
  Filled 2013-10-07: qty 20

## 2013-10-07 MED ORDER — BSS PLUS IO SOLN
INTRAOCULAR | Status: AC
  Filled 2013-10-07: qty 500

## 2013-10-07 MED ORDER — TETRACAINE HCL 0.5 % OP SOLN
OPHTHALMIC | Status: AC
  Filled 2013-10-07: qty 2

## 2013-10-07 MED ORDER — TOBRAMYCIN-DEXAMETHASONE 0.3-0.1 % OP OINT
TOPICAL_OINTMENT | OPHTHALMIC | Status: AC
  Filled 2013-10-07: qty 3.5

## 2013-10-07 MED ORDER — ATROPINE SULFATE 1 % OP SOLN
OPHTHALMIC | Status: DC | PRN
  Administered 2013-10-07: 1 [drp] via OPHTHALMIC

## 2013-10-07 MED ORDER — LIDOCAINE HCL 2 % IJ SOLN
INTRAMUSCULAR | Status: AC
  Filled 2013-10-07: qty 20

## 2013-10-07 MED ORDER — BUPIVACAINE HCL (PF) 0.75 % IJ SOLN
INTRAMUSCULAR | Status: AC
  Filled 2013-10-07: qty 10

## 2013-10-07 MED ORDER — 0.9 % SODIUM CHLORIDE (POUR BTL) OPTIME
TOPICAL | Status: DC | PRN
  Administered 2013-10-07: 1000 mL

## 2013-10-07 MED ORDER — BALANCED SALT IO SOLN
INTRAOCULAR | Status: DC | PRN
  Administered 2013-10-07: 15 mL via INTRAOCULAR

## 2013-10-07 MED ORDER — STERILE WATER FOR INJECTION IJ SOLN
INTRAMUSCULAR | Status: AC
  Filled 2013-10-07: qty 10

## 2013-10-07 MED ORDER — PHENYLEPHRINE HCL 2.5 % OP SOLN
1.0000 [drp] | OPHTHALMIC | Status: AC
  Administered 2013-10-07 (×3): 1 [drp] via OPHTHALMIC

## 2013-10-07 MED ORDER — MIDAZOLAM HCL 2 MG/2ML IJ SOLN
INTRAMUSCULAR | Status: AC
  Filled 2013-10-07: qty 2

## 2013-10-07 MED ORDER — EPINEPHRINE HCL 1 MG/ML IJ SOLN
INTRAMUSCULAR | Status: AC
  Filled 2013-10-07: qty 1

## 2013-10-07 MED ORDER — MIDAZOLAM HCL 5 MG/5ML IJ SOLN
INTRAMUSCULAR | Status: DC | PRN
  Administered 2013-10-07 (×2): 1 mg via INTRAVENOUS

## 2013-10-07 MED ORDER — HYALURONIDASE HUMAN 150 UNIT/ML IJ SOLN
INTRAMUSCULAR | Status: AC
  Filled 2013-10-07: qty 1

## 2013-10-07 MED ORDER — PHENYLEPHRINE HCL 2.5 % OP SOLN
OPHTHALMIC | Status: AC
  Filled 2013-10-07: qty 2

## 2013-10-07 MED ORDER — LIDOCAINE HCL 2 % IJ SOLN
INTRAMUSCULAR | Status: DC | PRN
  Administered 2013-10-07: 13:00:00 via RETROBULBAR

## 2013-10-07 SURGICAL SUPPLY — 68 items
ACCESSORY FRAGMATOME (MISCELLANEOUS) IMPLANT
APL SRG 3 HI ABS STRL LF PLS (MISCELLANEOUS) ×1
APPLICATOR DR MATTHEWS STRL (MISCELLANEOUS) ×1 IMPLANT
BLADE MINI 60D BLUE (BLADE) IMPLANT
BLADE MVR KNIFE 20G (BLADE) IMPLANT
CANNULA ANT CHAM MAIN (OPHTHALMIC RELATED) IMPLANT
CANNULA DUAL BORE 23G (CANNULA) IMPLANT
CANNULA VLV SOFT TIP 25G (OPHTHALMIC) ×1 IMPLANT
CANNULA VLV SOFT TIP 25GA (OPHTHALMIC) ×2 IMPLANT
CAUTERY EYE LOW TEMP 1300F FIN (OPHTHALMIC RELATED) IMPLANT
CLSR STERI-STRIP ANTIMIC 1/2X4 (GAUZE/BANDAGES/DRESSINGS) IMPLANT
CORDS BIPOLAR (ELECTRODE) IMPLANT
COVER MAYO STAND STRL (DRAPES) ×2 IMPLANT
DRAPE PROXIMA HALF (DRAPES) ×2 IMPLANT
DRAPE RETRACTOR (MISCELLANEOUS) ×2 IMPLANT
ERASER HMR WETFIELD 23G BP (MISCELLANEOUS) IMPLANT
FILTER BLUE MILLIPORE (MISCELLANEOUS) IMPLANT
FORCEPS ECKARDT ILM 25G SERR (OPHTHALMIC RELATED) IMPLANT
FORCEPS GRIESHABER ILM 25G A (INSTRUMENTS) IMPLANT
FORCEPS ILM 25G DSP TIP (MISCELLANEOUS) IMPLANT
GAS AUTO FILL CONSTEL (OPHTHALMIC)
GAS AUTO FILL CONSTELLATION (OPHTHALMIC) IMPLANT
GLOVE BIO SURGEON STRL SZ7.5 (GLOVE) ×2 IMPLANT
GLOVE BIOGEL PI IND STRL 7.0 (GLOVE) IMPLANT
GLOVE BIOGEL PI INDICATOR 7.0 (GLOVE) ×1
GLOVE SURG SS PI 7.0 STRL IVOR (GLOVE) ×1 IMPLANT
GLOVE SURG SS PI 7.5 STRL IVOR (GLOVE) ×1 IMPLANT
GOWN STRL REUS W/ TWL LRG LVL3 (GOWN DISPOSABLE) ×2 IMPLANT
GOWN STRL REUS W/TWL LRG LVL3 (GOWN DISPOSABLE) ×4
HANDLE PNEUMATIC FOR CONSTEL (OPHTHALMIC) IMPLANT
KIT BASIN OR (CUSTOM PROCEDURE TRAY) ×2 IMPLANT
KIT PERFLUORON PROCEDURE 5ML (MISCELLANEOUS) IMPLANT
KIT ROOM TURNOVER OR (KITS) ×2 IMPLANT
LENS BIOM SUPER VIEW SET DISP (OPHTHALMIC RELATED) ×2 IMPLANT
MICROPICK 25G (MISCELLANEOUS)
NDL 18GX1X1/2 (RX/OR ONLY) (NEEDLE) ×1 IMPLANT
NDL 25GX 5/8IN NON SAFETY (NEEDLE) ×1 IMPLANT
NDL FILTER BLUNT 18X1 1/2 (NEEDLE) IMPLANT
NDL HYPO 25GX1X1/2 BEV (NEEDLE) IMPLANT
NDL HYPO 30X.5 LL (NEEDLE) ×2 IMPLANT
NDL RETROBULBAR 25GX1.5 (NEEDLE) IMPLANT
NEEDLE 18GX1X1/2 (RX/OR ONLY) (NEEDLE) ×2 IMPLANT
NEEDLE 25GX 5/8IN NON SAFETY (NEEDLE) ×2 IMPLANT
NEEDLE FILTER BLUNT 18X 1/2SAF (NEEDLE) ×1
NEEDLE FILTER BLUNT 18X1 1/2 (NEEDLE) ×1 IMPLANT
NEEDLE HYPO 25GX1X1/2 BEV (NEEDLE) ×2 IMPLANT
NEEDLE HYPO 30X.5 LL (NEEDLE) ×4 IMPLANT
NEEDLE RETROBULBAR 25GX1.5 (NEEDLE) ×2 IMPLANT
NS IRRIG 1000ML POUR BTL (IV SOLUTION) ×2 IMPLANT
PACK FRAGMATOME (OPHTHALMIC) IMPLANT
PAD ARMBOARD 7.5X6 YLW CONV (MISCELLANEOUS) ×4 IMPLANT
PAK PIK VITRECTOMY CVS 25GA (OPHTHALMIC) ×2 IMPLANT
PENCIL BIPOLAR 25GA STR DISP (OPHTHALMIC RELATED) IMPLANT
PICK MICROPICK 25G (MISCELLANEOUS) IMPLANT
PROBE LASER ILLUM FLEX CVD 25G (OPHTHALMIC) IMPLANT
RETRACTOR IRIS FLEX 25G GRIESH (INSTRUMENTS) IMPLANT
ROLLS DENTAL (MISCELLANEOUS) IMPLANT
SCRAPER DIAMOND 25GA (OPHTHALMIC RELATED) IMPLANT
STOCKINETTE IMPERVIOUS 9X36 MD (GAUZE/BANDAGES/DRESSINGS) ×4 IMPLANT
STOPCOCK 4 WAY LG BORE MALE ST (IV SETS) IMPLANT
SUT VICRYL 7 0 TG140 8 (SUTURE) IMPLANT
SYR 20CC LL (SYRINGE) ×2 IMPLANT
SYR 5ML LL (SYRINGE) ×2 IMPLANT
SYR TB 1ML LUER SLIP (SYRINGE) ×1 IMPLANT
SYRINGE 10CC LL (SYRINGE) ×1 IMPLANT
TOWEL OR 17X24 6PK STRL BLUE (TOWEL DISPOSABLE) ×4 IMPLANT
TUBE CONNECTING 12X1/4 (SUCTIONS) IMPLANT
WATER STERILE IRR 1000ML POUR (IV SOLUTION) ×2 IMPLANT

## 2013-10-07 NOTE — Transfer of Care (Signed)
Immediate Anesthesia Transfer of Care Note  Patient: [name]  Procedure(s) Performed: Procedure(s): PARS PLANA VITRECTOMY WITH 25 GAUGE RIGHT EYE (Right)  Patient Location: PACU  Anesthesia Type:MAC  Level of Consciousness: awake, alert  and oriented  Airway & Oxygen Therapy: Patient Spontanous Breathing  Post-op Assessment: Report given to PACU RN and Post -op Vital signs reviewed and stable  Post vital signs: Reviewed and stable  Complications: No apparent anesthesia complications

## 2013-10-07 NOTE — Discharge Instructions (Signed)
What to eat:  For your first meals, you should eat lightly; only small meals initially.  If you do not have nausea, you may eat larger meals.  Avoid spicy, greasy and heavy food.    General Anesthesia, Adult, Care After  Refer to this sheet in the next few weeks. These instructions provide you with information on caring for yourself after your procedure. Your health care provider may also give you more specific instructions. Your treatment has been planned according to current medical practices, but problems sometimes occur. Call your health care provider if you have any problems or questions after your procedure.  WHAT TO EXPECT AFTER THE PROCEDURE  After the procedure, it is typical to experience:  Sleepiness.  Nausea and vomiting. HOME CARE INSTRUCTIONS  For the first 24 hours after general anesthesia:  Have a responsible person with you.  Do not drive a car. If you are alone, do not take public transportation.  Do not drink alcohol.  Do not take medicine that has not been prescribed by your health care provider.  Do not sign important papers or make important decisions.  You may resume a normal diet and activities as directed by your health care provider.  Change bandages (dressings) as directed.  If you have questions or problems that seem related to general anesthesia, call the hospital and ask for the anesthetist or anesthesiologist on call. SEEK MEDICAL CARE IF:  You have nausea and vomiting that continue the day after anesthesia.  You develop a rash. SEEK IMMEDIATE MEDICAL CARE IF:  You have difficulty breathing.  You have chest pain.  You have any allergic problems. Document Released: 04/04/2000 Document Revised: 08/29/2012 Document Reviewed: 07/12/2012  [facility] Patient Information 2014 [place], [place].   Marland Kitchensh

## 2013-10-07 NOTE — Brief Op Note (Signed)
10/07/2013  1:59 PM  PATIENT:  [name]  58 y.o. female  PRE-OPERATIVE DIAGNOSIS:  Vitreous Opacities Right Eye  POST-OPERATIVE DIAGNOSIS:  Vitreous Opacities Right Eye  PROCEDURE:  Procedure(s): PARS PLANA VITRECTOMY WITH 25 GAUGE RIGHT EYE (Right)  SURGEON:  Surgeon(s) and Role:    * Corliss Parish, MD - Primary  PHYSICIAN ASSISTANT:   ASSISTANTS: none   ANESTHESIA:   MAC  EBL:  Total I/O In: 400 [I.V.:400] Out: -   BLOOD ADMINISTERED:none  DRAINS: none   LOCAL MEDICATIONS USED:  BUPIVICAINE   SPECIMEN:  No Specimen  DISPOSITION OF SPECIMEN:  N/A  COUNTS:  YES  TOURNIQUET:  * No tourniquets in log *  DICTATION: .Other Dictation: Dictation Number [id number]  PLAN OF CARE: Discharge to home after PACU  PATIENT DISPOSITION:  PACU - hemodynamically stable.   Delay start of Pharmacological VTE agent (>24hrs) due to surgical blood loss or risk of bleeding: not applicable

## 2013-10-07 NOTE — Anesthesia Postprocedure Evaluation (Signed)
  Anesthesia Post-op Note  Patient: [name]  Procedure(s) Performed: Procedure(s): PARS PLANA VITRECTOMY WITH 25 GAUGE RIGHT EYE (Right)  Patient Location: PACU  Anesthesia Type: MAC  Level of Consciousness: awake and alert   Airway and Oxygen Therapy: Patient Spontanous Breathing  Post-op Pain: none  Post-op Assessment: Post-op Vital signs reviewed, Patient's Cardiovascular Status Stable and Respiratory Function Stable  Post-op Vital Signs: Reviewed  Filed Vitals:   10/07/13 1357  BP: 122/68  Pulse: 70  Temp: 36.6 C  Resp: 15    Complications: No apparent anesthesia complications

## 2013-10-07 NOTE — H&P (Signed)
Pre-operative History and Physical for Ophthalmic Surgery  [name] 10/07/2013                  Chief Complaint: floaters right eye, unable to read   Diagnosis: vitreous opacities    Eye Exam: dense vitreous opacities OD  Risks and benefits of proposed cataract surgery was discussed in detail inclusive of retina tear, retina detachment, vision loss, infection...  The patient indicated understanding our discussion and desires to proceed with planned surgery.       Prior to Admission medications   Medication Sig Start Date End Date Taking? Authorizing Provider  CALCIUM-VITAMIN D PO Take 1 tablet by mouth daily.   Yes Historical Provider, MD  Multiple Vitamins-Minerals (MULTIVITAMIN WITH MINERALS) tablet Take 1 tablet by mouth daily.     Yes Historical Provider, MD  omeprazole (PRILOSEC) 20 MG capsule Take 20 mg by mouth daily as needed (for acid reflux).   Yes Historical Provider, MD  pantoprazole (PROTONIX) 40 MG tablet Take 40 mg by mouth daily as needed (for acid reflux).   Yes Historical Provider, MD    Planned Procedure:   *  Past Medical History  Diagnosis Date  . Meniere's disease     L  hearing loss related to same  . PMB (postmenopausal bleeding) 12/07    proliferative endo. No HRT, PUS normal  . Osteoporosis   . GERD (gastroesophageal reflux disease)     takes Omeprazole and Protonix daily  . History of bronchitis     15+yrs ago  . History of vertigo     takes Antivert daily as needed  . Joint pain   . H/O hiatal hernia   . History of colon polyps     Past Surgical History  Procedure Laterality Date  . Tubal ligation    . Irrigation and debridement sebaceous cyst  1980 & 1985  . Abscess drainage  09/2010    suprapubic  . Btsp  1994  . Cystectomy      from buttock  . Colonoscopy w/ biopsies  12/12/05    hyperplastic polyp, recheck in 10 years     History   Social History  . Marital Status: Single    Spouse Name: N/A    Number of Children:  N/A  . Years of Education: N/A   Occupational History  . Not on file.   Social History Main Topics  . Smoking status: Never Smoker   . Smokeless tobacco: Never Used  . Alcohol Use: Yes     Comment: socially  . Drug Use: No  . Sexual Activity: Yes    Birth Control/ Protection: Post-menopausal     Comment: BTL   Other Topics Concern  . Not on file   Social History Narrative  . No narrative on file      HEENT:  Eye Findings: Vitreous opacities                   right eye   Impression: vit opacities od  Planned Procedure:  PPV OD    Corliss Parish, MD

## 2013-10-07 NOTE — Anesthesia Preprocedure Evaluation (Addendum)
Anesthesia Evaluation  Patient identified by MRN, date of birth, ID band Patient awake    Reviewed: Allergy & Precautions, H&P , NPO status , Patient's Chart, lab work & pertinent test results  History of Anesthesia Complications Negative for: history of anesthetic complications  Airway Mallampati: I  Neck ROM: Full    Dental  (+) Teeth Intact   Pulmonary neg pulmonary ROS,  breath sounds clear to auscultation        Cardiovascular Rhythm:Regular Rate:Normal     Neuro/Psych    GI/Hepatic Neg liver ROS, hiatal hernia, GERD-  ,  Endo/Other  negative endocrine ROS  Renal/GU negative Renal ROS     Musculoskeletal   Abdominal   Peds  Hematology negative hematology ROS (+)   Anesthesia Other Findings   Reproductive/Obstetrics                          Anesthesia Physical Anesthesia Plan  ASA: II  Anesthesia Plan: MAC   Post-op Pain Management:    Induction: Intravenous  Airway Management Planned: Natural Airway and Nasal Cannula  Additional Equipment:   Intra-op Plan:   Post-operative Plan:   Informed Consent: I have reviewed the patients History and Physical, chart, labs and discussed the procedure including the risks, benefits and alternatives for the proposed anesthesia with the patient or authorized representative who has indicated his/her understanding and acceptance.   Dental advisory given  Plan Discussed with:   Anesthesia Plan Comments:         Anesthesia Quick Evaluation

## 2013-10-07 NOTE — Anesthesia Procedure Notes (Signed)
Procedure Name: MAC Date/Time: 10/07/2013 1:06 PM Performed by: Susa Loffler Pre-anesthesia Checklist: Patient identified, Timeout performed, Emergency Drugs available, Suction available and Patient being monitored Patient Re-evaluated:Patient Re-evaluated prior to inductionOxygen Delivery Method: Nasal cannula Dental Injury: Teeth and Oropharynx as per pre-operative assessment

## 2013-10-08 NOTE — Op Note (Signed)
[name], [name] NO.:  [id number]  MEDICAL RECORD NO.:  [id number]  LOCATION:  [facility]                         FACILITY:  [facility]  PHYSICIAN:  Corliss Parish, MD    DATE OF BIRTH:  July 20, 1955  DATE OF PROCEDURE:  10/07/2013 DATE OF DISCHARGE:  10/07/2013                              OPERATIVE REPORT   SURGEON:  Corliss Parish, MD.  ANESTHESIA:  Local MAC with retrobulbar injection of the right eye.  PREOPERATIVE DIAGNOSIS:  Vitreous opacities.  POSTOPERATIVE DIAGNOSIS:  Vitreous opacities.  OPERATION PROCEDURE:  A 25-gauge pars plana vitrectomy of the right eye.  COMPLICATIONS:  None.  FINDINGS:  There was dense vitreous opacities in the right eye.  DESCRIPTION OF PROCEDURE:  [name] was identified in the preoperative holding area.  After informed consent was signed, she was taken to the operative room where she was sedated by Anesthesia.  At that point in time, the right eye was anesthetized using a retrobulbar injection of a 1:1 mixture of 0.75% bupivacaine and 1% lidocaine with 150 units of Halonix.  After excellent akinesia, an anesthesia was obtained to the right eye.  The right eye was prepped and draped in usual sterile fashion per ocular surgery including trimming of lashes.  A Lieberman speculum was placed between the right upper and lower eyelids for exposure.  A 25-gauge trocar was then used to introduce transconjunctival cannulas in the inferotemporal, superotemporal and superonasal quadrants.  After this was completed, an infusion cannula was confirmed to be in the vitreous cavity prior to its use and attached to the inferior temporal cannula.  The infusion cannula was then turned on.  I then underwent a core vitrectomy with vitreous cutter and light pipe.  Posterior hyaloid was elevated off the optic nerve head using the vitrector.  Vitreous was then trimmed out to the periphery using the vitrector and light pipe.  After this was  completed, the eye was inspected with scleral depression.  There was no retinal tear detachment seen by careful exam.  Therefore, the cannulas were removed from the sclerotomies.  Sclerotomies were inspected and found to be water tight. The eye was infused with subconjunctival injections of 15 mg Ancef to 1 mg dexamethasone.  The eye was treated tropically with 1 drop of 1% atropine and TobraDex ointment.  Speculum was removed. The eyelids were cleaned and closed.  They were then patched and shielded.  The patient was then taken to the recovery in excellent condition having tolerated the procedure well.     Corliss Parish, MD     JBS/MEDQ  D:  10/07/2013  T:  10/08/2013  Job:  [id number]

## 2013-10-09 ENCOUNTER — Encounter (HOSPITAL_COMMUNITY): Payer: Self-pay | Admitting: Ophthalmology

## 2013-11-12 ENCOUNTER — Other Ambulatory Visit: Payer: Self-pay | Admitting: Ophthalmology

## 2013-12-11 ENCOUNTER — Encounter (HOSPITAL_COMMUNITY): Payer: Self-pay

## 2013-12-11 ENCOUNTER — Encounter (HOSPITAL_COMMUNITY)
Admission: RE | Admit: 2013-12-11 | Discharge: 2013-12-11 | Disposition: A | Discharge status: Home / Self Care | Payer: 59 | Source: Ambulatory Visit | Attending: Ophthalmology | Admitting: Ophthalmology

## 2013-12-11 DIAGNOSIS — Z01812 Encounter for preprocedural laboratory examination: Secondary | ICD-10-CM | POA: Diagnosis not present

## 2013-12-11 LAB — BASIC METABOLIC PANEL
ANION GAP: 11 (ref 5–15)
BUN: 17 mg/dL (ref 6–23)
CHLORIDE: 101 meq/L (ref 96–112)
CO2: 29 mEq/L (ref 19–32)
CREATININE: 0.73 mg/dL (ref 0.50–1.10)
Calcium: 9.3 mg/dL (ref 8.4–10.5)
GFR calc non Af Amer: >90 mL/min (ref >90)
Glucose, Bld: 92 mg/dL (ref 70–99)
Potassium: 4.5 mEq/L (ref 3.7–5.3)
SODIUM: 141 meq/L (ref 137–147)

## 2013-12-11 LAB — CBC
HCT: 39.6 % (ref 36.0–46.0)
HEMOGLOBIN: 13.7 g/dL (ref 12.0–15.0)
MCH: 31.9 pg (ref 26.0–34.0)
MCHC: 34.6 g/dL (ref 30.0–36.0)
MCV: 92.3 fL (ref 78.0–100.0)
Platelets: 222 10*3/uL (ref 150–400)
RBC: 4.29 MIL/uL (ref 3.87–5.11)
RDW: 12.6 % (ref 11.5–15.5)
WBC: 7.8 10*3/uL (ref 4.0–10.5)

## 2013-12-11 NOTE — Pre-Procedure Instructions (Signed)
[name]  12/11/2013   Your procedure is scheduled on:  Monday December 23 2013 at 1245 PM  Report to [facility] Admitting at 1045 AM.  Call this number if you have problems the morning of surgery: [phone]   Remember:   Do not eat food or drink liquids after midnight.   Take these medicines the morning of surgery with A SIP OF WATER: Protonix   Do not wear jewelry, make-up or nail polish.  Do not wear lotions, powders, or perfumes. You may wear deodorant.  Do not shave 48 hours prior to surgery.   Do not bring valuables to the hospital.  [facility] is not responsible                  for any belongings or valuables.               Contacts, dentures or bridgework may not be worn into surgery.  Leave suitcase in the car. After surgery it may be brought to your room.  For patients admitted to the hospital, discharge time is determined by your                treatment team.               Patients discharged the day of surgery will not be allowed to drive  home.    Special Instructions: [place] - Preparing for Surgery  Before surgery, you can play an important role.  Because skin is not sterile, your skin needs to be as free of germs as possible.  You can reduce the number of germs on you skin by washing with CHG (chlorahexidine gluconate) soap before surgery.  CHG is an antiseptic cleaner which kills germs and bonds with the skin to continue killing germs even after washing.  Please DO NOT use if you have an allergy to CHG or antibacterial soaps.  If your skin becomes reddened/irritated stop using the CHG and inform your nurse when you arrive at Short Stay.  Do not shave (including legs and underarms) for at least 48 hours prior to the first CHG shower.  You may shave your face.  Please follow these instructions carefully:   1.  Shower with CHG Soap the night before surgery and the                                morning of Surgery.  2.  If you choose to wash  your hair, wash your hair first as usual with your       normal shampoo.  3.  After you shampoo, rinse your hair and body thoroughly to remove the                      Shampoo.  4.  Use CHG as you would any other liquid soap.  You can apply chg directly       to the skin and wash gently with scrungie or a clean washcloth.  5.  Apply the CHG Soap to your body ONLY FROM THE NECK DOWN.        Do not use on open wounds or open sores.  Avoid contact with your eyes,       ears, mouth and genitals (private parts).  Wash genitals (private parts)       with your normal soap.  6.  Wash thoroughly, paying  special attention to the area where your surgery        will be performed.  7.  Thoroughly rinse your body with warm water from the neck down.  8.  DO NOT shower/wash with your normal soap after using and rinsing off       the CHG Soap.  9.  Pat yourself dry with a clean towel.            10.  Wear clean pajamas.            11.  Place clean sheets on your bed the night of your first shower and do not        sleep with pets.  Day of Surgery  Do not apply any lotions/deoderants the morning of surgery.  Please wear clean clothes to the hospital/surgery center.      Please read over the following fact sheets that you were given: Pain Booklet, Coughing and Deep Breathing and Surgical Site Infection Prevention

## 2013-12-23 ENCOUNTER — Encounter (HOSPITAL_COMMUNITY): Payer: Self-pay | Admitting: *Deleted

## 2013-12-23 ENCOUNTER — Ambulatory Visit (HOSPITAL_COMMUNITY): Payer: 59 | Admitting: Anesthesiology

## 2013-12-23 ENCOUNTER — Ambulatory Visit (HOSPITAL_COMMUNITY)
Admission: RE | Admit: 2013-12-23 | Discharge: 2013-12-23 | Disposition: A | Discharge status: Home / Self Care | Payer: 59 | Source: Ambulatory Visit | Attending: Ophthalmology | Admitting: Ophthalmology

## 2013-12-23 ENCOUNTER — Encounter (HOSPITAL_COMMUNITY)
Admission: RE | Disposition: A | Discharge status: Home / Self Care | Payer: Self-pay | Source: Ambulatory Visit | Attending: Ophthalmology

## 2013-12-23 DIAGNOSIS — K449 Diaphragmatic hernia without obstruction or gangrene: Secondary | ICD-10-CM | POA: Insufficient documentation

## 2013-12-23 DIAGNOSIS — Z8261 Family history of arthritis: Secondary | ICD-10-CM | POA: Insufficient documentation

## 2013-12-23 DIAGNOSIS — Z8349 Family history of other endocrine, nutritional and metabolic diseases: Secondary | ICD-10-CM | POA: Insufficient documentation

## 2013-12-23 DIAGNOSIS — M810 Age-related osteoporosis without current pathological fracture: Secondary | ICD-10-CM | POA: Diagnosis not present

## 2013-12-23 DIAGNOSIS — H8102 Meniere's disease, left ear: Secondary | ICD-10-CM | POA: Insufficient documentation

## 2013-12-23 DIAGNOSIS — Z823 Family history of stroke: Secondary | ICD-10-CM | POA: Diagnosis not present

## 2013-12-23 DIAGNOSIS — H35371 Puckering of macula, right eye: Secondary | ICD-10-CM | POA: Diagnosis present

## 2013-12-23 DIAGNOSIS — Z8249 Family history of ischemic heart disease and other diseases of the circulatory system: Secondary | ICD-10-CM | POA: Diagnosis not present

## 2013-12-23 DIAGNOSIS — Z882 Allergy status to sulfonamides status: Secondary | ICD-10-CM | POA: Diagnosis not present

## 2013-12-23 DIAGNOSIS — N950 Postmenopausal bleeding: Secondary | ICD-10-CM | POA: Insufficient documentation

## 2013-12-23 DIAGNOSIS — Z86010 Personal history of colonic polyps: Secondary | ICD-10-CM | POA: Diagnosis not present

## 2013-12-23 DIAGNOSIS — H35341 Macular cyst, hole, or pseudohole, right eye: Secondary | ICD-10-CM

## 2013-12-23 DIAGNOSIS — Z91040 Latex allergy status: Secondary | ICD-10-CM | POA: Insufficient documentation

## 2013-12-23 DIAGNOSIS — K219 Gastro-esophageal reflux disease without esophagitis: Secondary | ICD-10-CM | POA: Insufficient documentation

## 2013-12-23 DIAGNOSIS — H547 Unspecified visual loss: Secondary | ICD-10-CM | POA: Diagnosis not present

## 2013-12-23 DIAGNOSIS — Z8262 Family history of osteoporosis: Secondary | ICD-10-CM | POA: Insufficient documentation

## 2013-12-23 HISTORY — PX: PARS PLANA VITRECTOMY: SHX2166

## 2013-12-23 HISTORY — PX: MEMBRANE PEEL: SHX5967

## 2013-12-23 HISTORY — PX: GAS/FLUID EXCHANGE: SHX5334

## 2013-12-23 SURGERY — PARS PLANA VITRECTOMY WITH 25 GAUGE
Anesthesia: Monitor Anesthesia Care | Site: Eye | Laterality: Right

## 2013-12-23 MED ORDER — HYPROMELLOSE (GONIOSCOPIC) 2.5 % OP SOLN
OPHTHALMIC | Status: AC
  Filled 2013-12-23: qty 15

## 2013-12-23 MED ORDER — MIDAZOLAM HCL 2 MG/2ML IJ SOLN
INTRAMUSCULAR | Status: AC
  Filled 2013-12-23: qty 2

## 2013-12-23 MED ORDER — DEXAMETHASONE SODIUM PHOSPHATE 10 MG/ML IJ SOLN
INTRAMUSCULAR | Status: DC | PRN
  Administered 2013-12-23: 10 mg via INTRAVENOUS

## 2013-12-23 MED ORDER — PROPOFOL 10 MG/ML IV BOLUS
INTRAVENOUS | Status: AC
  Filled 2013-12-23: qty 20

## 2013-12-23 MED ORDER — SODIUM CHLORIDE 0.9 % IJ SOLN
INTRAMUSCULAR | Status: AC
  Filled 2013-12-23: qty 10

## 2013-12-23 MED ORDER — PROPOFOL 10 MG/ML IV BOLUS
INTRAVENOUS | Status: DC | PRN
  Administered 2013-12-23: 40 mg via INTRAVENOUS

## 2013-12-23 MED ORDER — FENTANYL CITRATE 0.05 MG/ML IJ SOLN
INTRAMUSCULAR | Status: AC
  Filled 2013-12-23: qty 5

## 2013-12-23 MED ORDER — LIDOCAINE HCL (CARDIAC) 20 MG/ML IV SOLN
INTRAVENOUS | Status: DC | PRN
  Administered 2013-12-23: 20 mg via INTRAVENOUS

## 2013-12-23 MED ORDER — HYALURONIDASE HUMAN 150 UNIT/ML IJ SOLN
INTRAMUSCULAR | Status: AC
  Filled 2013-12-23: qty 1

## 2013-12-23 MED ORDER — HYPROMELLOSE (GONIOSCOPIC) 2.5 % OP SOLN
OPHTHALMIC | Status: DC | PRN
  Administered 2013-12-23: 2 [drp] via OPHTHALMIC

## 2013-12-23 MED ORDER — SODIUM CHLORIDE 0.9 % IV SOLN
INTRAVENOUS | Status: DC | PRN
  Administered 2013-12-23 (×2): via INTRAVENOUS

## 2013-12-23 MED ORDER — DEXAMETHASONE SODIUM PHOSPHATE 10 MG/ML IJ SOLN
INTRAMUSCULAR | Status: AC
  Filled 2013-12-23: qty 1

## 2013-12-23 MED ORDER — BSS PLUS IO SOLN
INTRAOCULAR | Status: AC
  Filled 2013-12-23: qty 500

## 2013-12-23 MED ORDER — MIDAZOLAM HCL 2 MG/2ML IJ SOLN
INTRAMUSCULAR | Status: DC | PRN
  Administered 2013-12-23 (×2): 1 mg via INTRAVENOUS

## 2013-12-23 MED ORDER — ATROPINE SULFATE 1 % OP SOLN
OPHTHALMIC | Status: AC
  Filled 2013-12-23: qty 2

## 2013-12-23 MED ORDER — TOBRAMYCIN-DEXAMETHASONE 0.3-0.1 % OP OINT
TOPICAL_OINTMENT | OPHTHALMIC | Status: AC
  Filled 2013-12-23: qty 3.5

## 2013-12-23 MED ORDER — SODIUM CHLORIDE 0.9 % IV SOLN
INTRAVENOUS | Status: DC
  Administered 2013-12-23: 35 mL/h via INTRAVENOUS

## 2013-12-23 MED ORDER — POLYMYXIN B SULFATE 500000 UNITS IJ SOLR
INTRAMUSCULAR | Status: AC
  Filled 2013-12-23: qty 1

## 2013-12-23 MED ORDER — GENTAMICIN SULFATE 40 MG/ML IJ SOLN
INTRAMUSCULAR | Status: AC
  Filled 2013-12-23: qty 2

## 2013-12-23 MED ORDER — BUPIVACAINE HCL (PF) 0.75 % IJ SOLN
INTRAMUSCULAR | Status: AC
  Filled 2013-12-23: qty 10

## 2013-12-23 MED ORDER — EPINEPHRINE HCL 1 MG/ML IJ SOLN
INTRAOCULAR | Status: DC | PRN
  Administered 2013-12-23: 14:00:00

## 2013-12-23 MED ORDER — PHENYLEPHRINE HCL 2.5 % OP SOLN
OPHTHALMIC | Status: AC
  Administered 2013-12-23: 1 [drp] via OPHTHALMIC
  Filled 2013-12-23: qty 2

## 2013-12-23 MED ORDER — PHENYLEPHRINE HCL 2.5 % OP SOLN: 1.0000 [drp] | OPHTHALMIC | Status: DC

## 2013-12-23 MED ORDER — LIDOCAINE HCL 2 % IJ SOLN
INTRAMUSCULAR | Status: DC | PRN
  Administered 2013-12-23: 14:00:00 via RETROBULBAR

## 2013-12-23 MED ORDER — FENTANYL CITRATE 0.05 MG/ML IJ SOLN
INTRAMUSCULAR | Status: DC | PRN
  Administered 2013-12-23 (×2): 50 ug via INTRAVENOUS

## 2013-12-23 MED ORDER — EPINEPHRINE HCL 1 MG/ML IJ SOLN
INTRAMUSCULAR | Status: AC
  Filled 2013-12-23: qty 1

## 2013-12-23 MED ORDER — SODIUM HYALURONATE 10 MG/ML IO SOLN
INTRAOCULAR | Status: AC
  Filled 2013-12-23: qty 0.85

## 2013-12-23 MED ORDER — CEFAZOLIN SUBCONJUNCTIVAL INJECTION 100 MG/0.5 ML
200.0000 mg | INJECTION | SUBCONJUNCTIVAL | Status: AC
  Administered 2013-12-23: 100 mg via SUBCONJUNCTIVAL
  Filled 2013-12-23: qty 5

## 2013-12-23 MED ORDER — ONDANSETRON HCL 4 MG/2ML IJ SOLN
INTRAMUSCULAR | Status: DC | PRN
  Administered 2013-12-23: 4 mg via INTRAVENOUS

## 2013-12-23 MED ORDER — INDOCYANINE GREEN 25 MG IV SOLR
1.2500 mg | Freq: Once | INTRAVENOUS | Status: AC
  Administered 2013-12-23: 1.25 mg via TOPICAL
  Filled 2013-12-23: qty 25

## 2013-12-23 MED ORDER — ATROPINE SULFATE 1 % OP SOLN
1.0000 [drp] | OPHTHALMIC | Status: AC
  Administered 2013-12-23 (×3): 1 [drp] via OPHTHALMIC

## 2013-12-23 MED ORDER — PHENYLEPHRINE HCL 2.5 % OP SOLN
1.0000 [drp] | OPHTHALMIC | Status: AC
  Administered 2013-12-23 (×3): 1 [drp] via OPHTHALMIC

## 2013-12-23 MED ORDER — ATROPINE SULFATE 1 % OP SOLN
1.0000 [drp] | OPHTHALMIC | Status: DC
Start: 2013-12-23 — End: 2013-12-23

## 2013-12-23 MED ORDER — ATROPINE SULFATE 1 % OP SOLN
OPHTHALMIC | Status: AC
Start: 2013-12-23 — End: 2013-12-23
  Administered 2013-12-23: 1 [drp] via OPHTHALMIC
  Filled 2013-12-23: qty 2

## 2013-12-23 MED ORDER — TOBRAMYCIN 0.3 % OP OINT
TOPICAL_OINTMENT | OPHTHALMIC | Status: DC | PRN
  Administered 2013-12-23: 1 via OPHTHALMIC

## 2013-12-23 MED ORDER — LIDOCAINE HCL 2 % IJ SOLN
INTRAMUSCULAR | Status: AC
  Filled 2013-12-23: qty 20

## 2013-12-23 MED ORDER — ATROPINE SULFATE 1 % OP SOLN: 1.0000 [drp] | OPHTHALMIC | Status: DC

## 2013-12-23 MED ORDER — BSS IO SOLN
INTRAOCULAR | Status: AC
  Filled 2013-12-23: qty 15

## 2013-12-23 SURGICAL SUPPLY — 60 items
BLADE MVR KNIFE 20G (BLADE) IMPLANT
CANNULA ANT CHAM MAIN (OPHTHALMIC RELATED) IMPLANT
CANNULA DUAL BORE 23G (CANNULA) IMPLANT
CANNULA VLV SOFT TIP 25G (OPHTHALMIC) ×1 IMPLANT
CANNULA VLV SOFT TIP 25GA (OPHTHALMIC) ×2 IMPLANT
CAUTERY EYE LOW TEMP 1300F FIN (OPHTHALMIC RELATED) IMPLANT
CLSR STERI-STRIP ANTIMIC 1/2X4 (GAUZE/BANDAGES/DRESSINGS) IMPLANT
CORDS BIPOLAR (ELECTRODE) IMPLANT
COVER MAYO STAND STRL (DRAPES) ×2 IMPLANT
DRAPE INCISE 51X51 W/FILM STRL (DRAPES) ×2 IMPLANT
DRAPE PROXIMA HALF (DRAPES) ×2 IMPLANT
DRAPE RETRACTOR (MISCELLANEOUS) ×2 IMPLANT
ERASER HMR WETFIELD 23G BP (MISCELLANEOUS) IMPLANT
FILTER BLUE MILLIPORE (MISCELLANEOUS) IMPLANT
FORCEPS ECKARDT ILM 25G SERR (OPHTHALMIC RELATED) IMPLANT
FORCEPS GRIESHABER ILM 25G A (INSTRUMENTS) ×1 IMPLANT
FORCEPS ILM 25G DSP TIP (MISCELLANEOUS) IMPLANT
GAS AUTO FILL CONSTEL (OPHTHALMIC)
GAS AUTO FILL CONSTELLATION (OPHTHALMIC) IMPLANT
GLOVE BIO SURGEON STRL SZ7.5 (GLOVE) ×2 IMPLANT
GLOVE SURG SS PI 7.0 STRL IVOR (GLOVE) ×1 IMPLANT
GLOVE SURG SS PI 7.5 STRL IVOR (GLOVE) ×1 IMPLANT
GOWN STRL REUS W/ TWL LRG LVL3 (GOWN DISPOSABLE) ×2 IMPLANT
GOWN STRL REUS W/TWL LRG LVL3 (GOWN DISPOSABLE) ×4
HANDLE PNEUMATIC FOR CONSTEL (OPHTHALMIC) ×1 IMPLANT
KIT BASIN OR (CUSTOM PROCEDURE TRAY) ×2 IMPLANT
KIT ROOM TURNOVER OR (KITS) ×2 IMPLANT
LENS BIOM SUPER VIEW SET DISP (OPHTHALMIC RELATED) ×2 IMPLANT
MICROPICK 25G (MISCELLANEOUS)
NDL 18GX1X1/2 (RX/OR ONLY) (NEEDLE) ×1 IMPLANT
NDL 25GX 5/8IN NON SAFETY (NEEDLE) ×1 IMPLANT
NDL FILTER BLUNT 18X1 1/2 (NEEDLE) IMPLANT
NDL HYPO 25GX1X1/2 BEV (NEEDLE) IMPLANT
NDL HYPO 30X.5 LL (NEEDLE) ×2 IMPLANT
NDL RETROBULBAR 25GX1.5 (NEEDLE) ×1 IMPLANT
NEEDLE 18GX1X1/2 (RX/OR ONLY) (NEEDLE) ×6 IMPLANT
NEEDLE 25GX 5/8IN NON SAFETY (NEEDLE) ×2 IMPLANT
NEEDLE FILTER BLUNT 18X 1/2SAF (NEEDLE)
NEEDLE FILTER BLUNT 18X1 1/2 (NEEDLE) IMPLANT
NEEDLE HYPO 25GX1X1/2 BEV (NEEDLE) IMPLANT
NEEDLE HYPO 30X.5 LL (NEEDLE) ×6 IMPLANT
NEEDLE RETROBULBAR 25GX1.5 (NEEDLE) ×2 IMPLANT
NS IRRIG 1000ML POUR BTL (IV SOLUTION) ×2 IMPLANT
PAD ARMBOARD 7.5X6 YLW CONV (MISCELLANEOUS) ×4 IMPLANT
PAK PIK VITRECTOMY CVS 25GA (OPHTHALMIC) ×2 IMPLANT
PENCIL BIPOLAR 25GA STR DISP (OPHTHALMIC RELATED) IMPLANT
PICK MICROPICK 25G (MISCELLANEOUS) IMPLANT
PROBE LASER ILLUM FLEX CVD 25G (OPHTHALMIC) IMPLANT
ROLLS DENTAL (MISCELLANEOUS) IMPLANT
SCRAPER DIAMOND 25GA (OPHTHALMIC RELATED) ×1 IMPLANT
STOCKINETTE IMPERVIOUS 9X36 MD (GAUZE/BANDAGES/DRESSINGS) ×4 IMPLANT
STOPCOCK 4 WAY LG BORE MALE ST (IV SETS) IMPLANT
SUT VICRYL 7 0 TG140 8 (SUTURE) IMPLANT
SYR 20CC LL (SYRINGE) ×2 IMPLANT
SYR 5ML LL (SYRINGE) ×3 IMPLANT
SYR TB 1ML LUER SLIP (SYRINGE) IMPLANT
SYRINGE 10CC LL (SYRINGE) ×1 IMPLANT
TOWEL OR 17X24 6PK STRL BLUE (TOWEL DISPOSABLE) ×4 IMPLANT
TUBE CONNECTING 12X1/4 (SUCTIONS) IMPLANT
WATER STERILE IRR 1000ML POUR (IV SOLUTION) ×2 IMPLANT

## 2013-12-23 NOTE — H&P (Signed)
[name] is an 58 y.o. female.   Chief Complaint: decreased vision OD  HPI: decreased vision in the central visual field OD, diagnosed with full thickness macular hole OD  Past Medical History  Diagnosis Date  . Meniere's disease     L  hearing loss related to same  . PMB (postmenopausal bleeding) 12/07    proliferative endo. No HRT, PUS normal  . Osteoporosis   . GERD (gastroesophageal reflux disease)     takes Omeprazole and Protonix daily  . History of bronchitis     15+yrs ago  . History of vertigo     takes Antivert daily as needed  . Joint pain   . H/O hiatal hernia   . History of colon polyps     Past Surgical History  Procedure Laterality Date  . Tubal ligation    . Irrigation and debridement sebaceous cyst  1980 & 1985  . Abscess drainage  09/2010    suprapubic  . Btsp  1994  . Cystectomy      from buttock  . Colonoscopy w/ biopsies  12/12/05    hyperplastic polyp, recheck in 10 years  . Pars plana vitrectomy Right 10/07/2013    Procedure: PARS PLANA VITRECTOMY WITH 25 GAUGE RIGHT EYE;  Surgeon: Corliss Parish, MD;  Location: [facility];  Service: Ophthalmology;  Laterality: Right;    Family History  Problem Relation Age of Onset  . Hypertension Mother   . Atrial fibrillation Mother   . Deep vein thrombosis Mother   . Osteoporosis Mother     compression fx  . Thyroid disease Mother   . Osteoarthritis Mother   . Stroke Father   . Arthritis Mother     shoulder replacement  . Anorexia nervosa Sister   . Osteoporosis Sister   . Thyroid disease Sister   . Diabetes Brother    Social History:  reports that she has never smoked. She has never used smokeless tobacco. She reports that she drinks alcohol. She reports that she does not use illicit drugs.  Allergies:  Allergies  Allergen Reactions  . Latex     Contact dermatitis  . Sulfonamide Derivatives     Childhood allergy, kidney problems    Medications Prior to Admission  Medication Sig Dispense Refill   . CALCIUM-VITAMIN D PO Take 1 tablet by mouth daily.    . ciprofloxacin (CILOXAN) 0.3 % ophthalmic solution Place 1 drop into the right eye 4 (four) times daily. Start one day prior to surgery    . Multiple Vitamins-Minerals (MULTIVITAMIN WITH MINERALS) tablet Take 1 tablet by mouth daily.      Marland Kitchen omeprazole (PRILOSEC) 20 MG capsule Take 20 mg by mouth daily as needed (for acid reflux).    . pantoprazole (PROTONIX) 40 MG tablet Take 40 mg by mouth daily as needed (for acid reflux).    . prednisoLONE acetate (PRED FORTE) 1 % ophthalmic suspension Place 1 drop into the right eye 4 (four) times daily. Start one day prior to surgery, then continue afterwards      No results found for this or any previous visit (from the past 4 hour(s)). No results found.  Review of Systems  Eyes: Positive for blurred vision.  All other systems reviewed and are negative.   Blood pressure 118/78, pulse 69, temperature 97.8 F (36.6 C), temperature source Oral, resp. rate 20, weight 60.782 kg (134 lb), last menstrual period 01/10/2006, SpO2 98 %. Physical Exam  Nursing note and vitals reviewed.  Eyes: Conjunctivae, EOM and lids are normal. Pupils are equal, round, and reactive to light.       Assessment/Plan 1: Macular Hole OD: PPV/ ILMP/ GFX OD today.  Evadene Wardrip B 12/23/2013, 12:48 PM

## 2013-12-23 NOTE — Discharge Instructions (Signed)

## 2013-12-23 NOTE — Transfer of Care (Signed)
Immediate Anesthesia Transfer of Care Note  Patient: [name]  Procedure(s) Performed: Procedure(s) with comments: PARS PLANA VITRECTOMY WITH 25 GAUGE (Right) MEMBRANE PEEL (Right) GAS/FLUID EXCHANGE (Right) - SF6  Patient Location: PACU  Anesthesia Type:MAC  Level of Consciousness: awake, alert , oriented and patient cooperative  Airway & Oxygen Therapy: Patient Spontanous Breathing  Post-op Assessment: Report given to PACU RN, Post -op Vital signs reviewed and stable and Patient moving all extremities  Post vital signs: Reviewed and stable  Complications: No apparent anesthesia complications

## 2013-12-23 NOTE — Anesthesia Postprocedure Evaluation (Signed)
  Anesthesia Post-op Note  Patient: [name]  Procedure(s) Performed: Procedure(s) with comments: PARS PLANA VITRECTOMY WITH 25 GAUGE (Right) MEMBRANE PEEL (Right) GAS/FLUID EXCHANGE (Right) - SF6  Patient Location: PACU  Anesthesia Type:MAC  Level of Consciousness: awake, alert  and oriented  Airway and Oxygen Therapy: Patient Spontanous Breathing  Post-op Pain: none  Post-op Assessment: Post-op Vital signs reviewed  Post-op Vital Signs: Reviewed  Last Vitals:  Filed Vitals:   12/23/13 1452  BP: 138/81  Pulse: 81  Temp:   Resp: 20    Complications: No apparent anesthesia complications

## 2013-12-23 NOTE — Brief Op Note (Signed)
12/23/2013  2:50 PM  PATIENT:  [name]  58 y.o. female  PRE-OPERATIVE DIAGNOSIS:  Macular hole H35.371  POST-OPERATIVE DIAGNOSIS:  Macular hole H35.371  PROCEDURE:  Procedure(s) with comments: PARS PLANA VITRECTOMY WITH 25 GAUGE (Right) MEMBRANE PEEL (Right) GAS/FLUID EXCHANGE (Right) - SF6  SURGEON:  Surgeon(s) and Role:    * Corliss Parish, MD - Primary  PHYSICIAN ASSISTANT:   ASSISTANTS: none   ANESTHESIA:   MAC  EBL:  Total I/O In: 500 [I.V.:500] Out: -   BLOOD ADMINISTERED:none  DRAINS: none   LOCAL MEDICATIONS USED:  BUPIVICAINE   SPECIMEN:  No Specimen  DISPOSITION OF SPECIMEN:  N/A  COUNTS:  YES  TOURNIQUET:  * No tourniquets in log *  DICTATION: .Other Dictation: Dictation Number [id number]  PLAN OF CARE: Discharge to home after PACU  PATIENT DISPOSITION:  PACU - hemodynamically stable.   Delay start of Pharmacological VTE agent (>24hrs) due to surgical blood loss or risk of bleeding: not applicable

## 2013-12-23 NOTE — Anesthesia Preprocedure Evaluation (Signed)
Anesthesia Evaluation  Patient identified by MRN, date of birth, ID band Patient awake    Reviewed: Allergy & Precautions, H&P , NPO status , Patient's Chart, lab work & pertinent test results  History of Anesthesia Complications Negative for: history of anesthetic complications  Airway Mallampati: I   Neck ROM: Full    Dental  (+) Teeth Intact   Pulmonary neg pulmonary ROS,  breath sounds clear to auscultation        Cardiovascular Rhythm:Regular Rate:Normal     Neuro/Psych    GI/Hepatic Neg liver ROS, hiatal hernia, GERD-  ,  Endo/Other  negative endocrine ROS  Renal/GU negative Renal ROS     Musculoskeletal   Abdominal   Peds  Hematology negative hematology ROS (+)   Anesthesia Other Findings   Reproductive/Obstetrics                             Anesthesia Physical  Anesthesia Plan  ASA: II  Anesthesia Plan: MAC   Post-op Pain Management:    Induction: Intravenous  Airway Management Planned: Natural Airway and Nasal Cannula  Additional Equipment:   Intra-op Plan:   Post-operative Plan:   Informed Consent: I have reviewed the patients History and Physical, chart, labs and discussed the procedure including the risks, benefits and alternatives for the proposed anesthesia with the patient or authorized representative who has indicated his/her understanding and acceptance.   Dental advisory given  Plan Discussed with:   Anesthesia Plan Comments:         Anesthesia Quick Evaluation

## 2013-12-24 ENCOUNTER — Encounter (HOSPITAL_COMMUNITY): Payer: Self-pay | Admitting: Ophthalmology

## 2013-12-24 NOTE — Op Note (Signed)
NAMEBRIANNA, [name] NO.:  [id number]  MEDICAL RECORD NO.:  [id number]  LOCATION:  [facility]                         FACILITY:  [facility]  PHYSICIAN:  Corliss Parish, MD    DATE OF BIRTH:  1955/10/11  DATE OF PROCEDURE:  12/23/2013 DATE OF DISCHARGE:  12/23/2013                              OPERATIVE REPORT   SURGEON:  Corliss Parish, MD.  ANESTHESIA:  Local MAC with retrobulbar injection of the right eye.  PREOPERATIVE DIAGNOSIS:  Full-thickness macular hole, right eye.  POSTOPERATIVE DIAGNOSIS:  Full-thickness macular hole, right eye.  OPERATIVE PROCEDURE:  A 25-gauge pars plana vitrectomy with membrane peeling of the internal limiting membrane, air fluid exchange, and gas- fluid exchange using 18% SF6 gas.  COMPLICATIONS:  None.  FINDINGS:  There is a full-thickness macular hole in the right eye in the epiretinal membrane.  DESCRIPTION OF PROCEDURE:  Ms. Yarde was identified in the preoperative holding area.  She was then taken to the operative room where she was sedated.  At that point in time, the right eye was anesthetized using retrobulbar block consisting of a 1:1 mixture of 0.75% bupivacaine, 1% lidocaine with 150 units of Halonix.  After excellent akinesia and anesthesia was obtained in the right eye, the right eye was prepped and draped in usual sterile fashion for ocular surgery including trimming of lashes.  Lieberman speculum was placed between the right upper and lower eyelids for exposure.  At that point, 25-gauge trocars were used to introduce transconjunctival cannulas in the inferotemporal, superotemporal, and superonasal quadrants.  The trocars were removed. An infusion cannula was then placed in the inferior temporal location and confirmed to be in the vitreous cavity prior to its use.  Next, she underwent a vitrectomy with vitreous cutter and light pipe. Vitreous was mostly removed from previous surgery.  After this was completed, the  macular surface was stained with a dilute concentration of ICG dye.  This was left in place for 60 seconds and then appropriately removed with the vitrector.  After this was removed, a contact lens was placed on the cornea for high magnification of the macular surface.  Next, ILM forceps were used to gently grasp and peel internal limiting membrane and epiretinal tissue off the macular surface in a non-traumatic fashion.  A very nice atraumatic peel was performed of the internal limiting membrane.  All membrane fragments were then removed from the vitreous cavity using the vitrector.  Following this, the retina was inspected using scleral depression.  There were no retinal tear, detachment, or other complications seen by careful examination.  Therefore, soft-tipped extrusion cannula was used to perform a complete air-fluid exchange.  Following this, the eye was flushed with an 18% concentration of SF6 gas.  Next, the cannulas were removed from the sclerotomies.  The sclerotomies were inspected and found to be air tight.  The intraocular pressure was assessed and found to be physiologic.  The eye was then treated with subconjunctival injections of 15 mg of Ancef to 1 mg of dexamethasone. The eye was then treated topically with one drop of 1% atropine and TobraDex ointment.  Speculum was removed.  The  eyelids were then cleaned and closed.  They were then patched and shielded.  The patient was then taken to the recovery in excellent condition having tolerated the procedure very well.     Corliss Parish, MD     JBS/MEDQ  D:  12/23/2013  T:  12/24/2013  Job:  [id number]

## 2014-03-28 ENCOUNTER — Encounter: Payer: Self-pay | Admitting: Internal Medicine

## 2014-03-28 ENCOUNTER — Ambulatory Visit (INDEPENDENT_AMBULATORY_CARE_PROVIDER_SITE_OTHER): Payer: 59 | Admitting: Internal Medicine

## 2014-03-28 VITALS — BP 116/74 | HR 74 | Temp 98.2°F | Resp 16 | Ht 63.0 in | Wt 132.1 lb

## 2014-03-28 DIAGNOSIS — K219 Gastro-esophageal reflux disease without esophagitis: Secondary | ICD-10-CM

## 2014-03-28 DIAGNOSIS — Z0000 Encounter for general adult medical examination without abnormal findings: Secondary | ICD-10-CM | POA: Insufficient documentation

## 2014-03-28 DIAGNOSIS — H9312 Tinnitus, left ear: Secondary | ICD-10-CM

## 2014-03-28 MED ORDER — PANTOPRAZOLE SODIUM 40 MG PO TBEC: 40.0000 mg | DELAYED_RELEASE_TABLET | Freq: Every day | ORAL | Status: DC | PRN

## 2014-03-28 NOTE — Assessment & Plan Note (Signed)
Refill of protonix and reviewed dietary choices best for acid reflux. She will work on it and call us back if not well controlled.

## 2014-03-28 NOTE — Patient Instructions (Signed)
We will check the blood work today and call you back about the results.   We have sent in the protonix for the stomach to your pharmacy. We recommend to take it before breakfast in the morning. If you are still having problems with reflux call us back and we can either switch or try to have you take the protonix twice a day.   Keep up the good work to stay healthy. Next year you are due for the colonoscopy again and we'll remind you next year.   Food Choices for Gastroesophageal Reflux Disease When you have gastroesophageal reflux disease (GERD), the foods you eat and your eating habits are very important. Choosing the right foods can help ease the discomfort of GERD. WHAT GENERAL GUIDELINES DO I NEED TO FOLLOW?  Choose fruits, vegetables, whole grains, low-fat dairy products, and low-fat meat, fish, and poultry.  Limit fats such as oils, salad dressings, butter, nuts, and avocado.  Keep a food diary to identify foods that cause symptoms.  Avoid foods that cause reflux. These may be different for different people.  Eat frequent small meals instead of three large meals each day.  Eat your meals slowly, in a relaxed setting.  Limit fried foods.  Cook foods using methods other than frying.  Avoid drinking alcohol.  Avoid drinking large amounts of liquids with your meals.  Avoid bending over or lying down until 2-3 hours after eating. WHAT FOODS ARE NOT RECOMMENDED? The following are some foods and drinks that may worsen your symptoms: Vegetables Tomatoes. Tomato juice. Tomato and spaghetti sauce. Chili peppers. Onion and garlic. Horseradish. Fruits Oranges, grapefruit, and lemon (fruit and juice). Meats High-fat meats, fish, and poultry. This includes hot dogs, ribs, ham, sausage, salami, and bacon. Dairy Whole milk and chocolate milk. Sour cream. Cream. Butter. Ice cream. Cream cheese.  Beverages Coffee and tea, with or without caffeine. Carbonated beverages or energy  drinks. Condiments Hot sauce. Barbecue sauce.  Sweets/Desserts Chocolate and cocoa. Donuts. Peppermint and spearmint. Fats and Oils High-fat foods, including [place] fries and potato chips. Other Vinegar. Strong spices, such as black pepper, white pepper, red pepper, cayenne, curry powder, cloves, ginger, and chili powder. The items listed above may not be a complete list of foods and beverages to avoid. Contact your dietitian for more information. Document Released: 12/27/2004 Document Revised: 01/01/2013 Document Reviewed: 10/31/2012 [facility] Patient Information 2015 [place], [place]. This information is not intended to replace advice given to you by your health care provider. Make sure you discuss any questions you have with your health care provider.

## 2014-03-28 NOTE — Progress Notes (Signed)
   Subjective:    Patient ID: [name], female    DOB: 28-Nov-1955, 59 y.o.   MRN: [id number]  HPI The patient is a 59 YO female who is coming in for wellness. She is still struggling some with acid reflux. Does okay overall but knows to avoid spaghetti sauce and pizza. Has had 2 eye surgeries recently and may need cataract removed in the near future. No new complaints or pain.   PMH, [facility], social history reviewed and updated today.   Review of Systems  Constitutional: Negative for fever, activity change, appetite change, fatigue and unexpected weight change.  HENT: Negative.   Eyes: Negative.   Respiratory: Negative for cough, chest tightness, shortness of breath and wheezing.   Cardiovascular: Negative for chest pain, palpitations and leg swelling.  Gastrointestinal: Negative for abdominal pain, diarrhea, constipation and abdominal distention.  Musculoskeletal: Positive for arthralgias.       Trigger finger bilaterally, not severe, does not get stuck  Skin: Negative.   Neurological: Negative.   Psychiatric/Behavioral: Negative.       Objective:   Physical Exam  Constitutional: She is oriented to person, place, and time. She appears well-developed and well-nourished.  HENT:  Head: Normocephalic and atraumatic.  Eyes: EOM are normal.  Neck: Normal range of motion.  Cardiovascular: Normal rate and regular rhythm.   Pulmonary/Chest: Effort normal and breath sounds normal. No respiratory distress. She has no wheezes. She has no rales.  Abdominal: Soft. Bowel sounds are normal. She exhibits no distension. There is no tenderness.  Musculoskeletal: She exhibits no edema.  Neurological: She is alert and oriented to person, place, and time. Coordination normal.  Skin: Skin is warm and dry.  Psychiatric: She has a normal mood and affect.   Filed Vitals:   03/28/14 0821  BP: 116/74  Pulse: 74  Temp: 98.2 F (36.8 C)  TempSrc: Oral  Resp: 16  Height: 5\' 3"  (1.6 m)  Weight: 132 lb  1.9 oz (59.929 kg)  SpO2: 98%      Assessment & Plan:

## 2014-03-28 NOTE — Progress Notes (Signed)
Pre visit review using our clinic review tool, if applicable. No additional management support is needed unless otherwise documented below in the visit note. 

## 2014-03-28 NOTE — Assessment & Plan Note (Signed)
Colonoscopy due in 2017, needs DEXA scan in the next year, tetanus and flu up to date. Checking hepatitis C antibody today.

## 2014-03-28 NOTE — Assessment & Plan Note (Signed)
Stable, follows with ENT. Has tried hearing aid in the past without good results.

## 2014-04-04 ENCOUNTER — Encounter: Payer: Self-pay | Admitting: Internal Medicine

## 2014-04-07 MED ORDER — FLUTICASONE PROPIONATE 50 MCG/ACT NA SUSP: 2.0000 | Freq: Every day | NASAL | Status: DC

## 2014-04-09 ENCOUNTER — Other Ambulatory Visit (INDEPENDENT_AMBULATORY_CARE_PROVIDER_SITE_OTHER): Payer: 59

## 2014-04-09 DIAGNOSIS — Z0000 Encounter for general adult medical examination without abnormal findings: Secondary | ICD-10-CM | POA: Diagnosis not present

## 2014-04-09 LAB — BASIC METABOLIC PANEL
BUN: 12 mg/dL (ref 6–23)
CHLORIDE: 104 meq/L (ref 96–112)
CO2: 31 mEq/L (ref 19–32)
Calcium: 9.3 mg/dL (ref 8.4–10.5)
Creatinine, Ser: 0.77 mg/dL (ref 0.40–1.20)
GFR: 81.41 mL/min (ref >60.00)
Glucose, Bld: 89 mg/dL (ref 70–99)
Potassium: 4.6 mEq/L (ref 3.5–5.1)
Sodium: 139 mEq/L (ref 135–145)

## 2014-04-09 LAB — LIPID PANEL
CHOL/HDL RATIO: 3
CHOLESTEROL: 203 mg/dL — AB (ref 0–200)
HDL: 77.6 mg/dL (ref >39.00)
LDL CALC: 108 mg/dL — AB (ref 0–99)
NONHDL: 125.4
Triglycerides: 86 mg/dL (ref 0.0–149.0)
VLDL: 17.2 mg/dL (ref 0.0–40.0)

## 2014-04-09 LAB — TSH: TSH: 1.93 u[IU]/mL (ref 0.35–4.50)

## 2014-04-10 LAB — HEPATITIS C ANTIBODY: HCV Ab: NEGATIVE

## 2014-05-05 ENCOUNTER — Telehealth: Payer: Self-pay | Admitting: Nurse Practitioner

## 2014-05-05 NOTE — Telephone Encounter (Signed)
Left message regarding upcoming appointment has been canceled and needs to be rescheduled. °

## 2014-10-10 ENCOUNTER — Ambulatory Visit: Payer: 59 | Admitting: Nurse Practitioner

## 2014-10-11 HISTORY — PX: CATARACT EXTRACTION EXTRACAPSULAR: SHX1305

## 2015-01-16 ENCOUNTER — Other Ambulatory Visit: Payer: Self-pay | Admitting: Internal Medicine

## 2015-01-22 ENCOUNTER — Telehealth: Payer: Self-pay

## 2015-01-22 NOTE — Telephone Encounter (Signed)
[facility] pharm requesting rx refill for nexium 40mg  capsule (once daily)---this med is not currently on patients med list---please advise, thanks

## 2015-01-22 NOTE — Telephone Encounter (Signed)
Is the protonix not working?

## 2015-01-23 NOTE — Telephone Encounter (Signed)
Left message asking patient to call back----currently showing patient uses protonix---but rx request is for nexium----is protonix not working?? Or why is patient requesting nexium????

## 2015-01-27 MED FILL — PANTOPRAZOLE SOD DR 40 MG T: 40 | 90 days supply | Qty: 90 | Fill #1

## 2015-07-03 DIAGNOSIS — M65342 Trigger finger, left ring finger: Secondary | ICD-10-CM | POA: Diagnosis not present

## 2015-10-19 ENCOUNTER — Encounter: Payer: Self-pay | Admitting: Nurse Practitioner

## 2015-10-19 ENCOUNTER — Ambulatory Visit (INDEPENDENT_AMBULATORY_CARE_PROVIDER_SITE_OTHER): Payer: 59 | Admitting: Nurse Practitioner

## 2015-10-19 VITALS — BP 142/84 | HR 68 | Ht 63.5 in | Wt 132.0 lb

## 2015-10-19 DIAGNOSIS — Z872 Personal history of diseases of the skin and subcutaneous tissue: Secondary | ICD-10-CM

## 2015-10-19 DIAGNOSIS — Z124 Encounter for screening for malignant neoplasm of cervix: Secondary | ICD-10-CM | POA: Diagnosis not present

## 2015-10-19 DIAGNOSIS — Z1151 Encounter for screening for human papillomavirus (HPV): Secondary | ICD-10-CM | POA: Diagnosis not present

## 2015-10-19 DIAGNOSIS — Z0000 Encounter for general adult medical examination without abnormal findings: Secondary | ICD-10-CM | POA: Diagnosis not present

## 2015-10-19 DIAGNOSIS — Z01419 Encounter for gynecological examination (general) (routine) without abnormal findings: Secondary | ICD-10-CM

## 2015-10-19 DIAGNOSIS — E2839 Other primary ovarian failure: Secondary | ICD-10-CM | POA: Diagnosis not present

## 2015-10-19 MED ORDER — METRONIDAZOLE 1 % EX GEL: Freq: Every day | CUTANEOUS | 0 refills | Status: DC

## 2015-10-19 NOTE — Progress Notes (Signed)
Patient ID: [name], female   DOB: 05/09/1955, 60 y.o.   MRN: [id number]  60 y.o. [id number] Single  Caucasian Fe here for annual exam.  3 surgeries on right eye.  First was 10/07/13 vitrectomy, then followed with repair of macular hole, then followed with cataract 10/30/14. Still vision is not very good.  Partner has already retired.  Rosacea has flared.  Mother passed last June at age 41.  Patient's last menstrual period was 01/10/2006.          Sexually active: Yes.    The current method of family planning is tubal ligation and post menopausal status.    Exercising: Yes.    walking and bike riding Smoker:  no  Health Maintenance: Pap: 07/2011 Neg. HR HPV neg MMG: 08/16/13, Bi-Rad 1: Negative  Colonoscopy: 12/2005- hyperplastic polyp - recheck in 10 years  BMD:  2011 TDaP:  2008 Hep C: 04/09/14 Shingles:  Not yet HIV: 07/2015, blood donation Pneumonia: Not indicated due to age Labs: PCP takes care of all labs (in [facility])   reports that she has never smoked. She has never used smokeless tobacco. She reports that she drinks alcohol. She reports that she does not use drugs.  Past Medical History:  Diagnosis Date  . GERD (gastroesophageal reflux disease)    takes Omeprazole and Protonix daily  . H/O hiatal hernia   . History of bronchitis    15+yrs ago  . History of colon polyps   . History of vertigo    takes Antivert daily as needed  . Joint pain   . Meniere's disease    L  hearing loss related to same  . Osteoporosis   . PMB (postmenopausal bleeding) 12/07   proliferative endo. No HRT, PUS normal    Past Surgical History:  Procedure Laterality Date  . ABSCESS DRAINAGE  09/2010   suprapubic  . BTSP  1994  . CATARACT EXTRACTION EXTRACAPSULAR Right 10/2014  . COLONOSCOPY W/ BIOPSIES  12/12/05   hyperplastic polyp, recheck in 10 years  . CYSTECTOMY     from buttock  . GAS/FLUID EXCHANGE Right 12/23/2013   Procedure: GAS/FLUID EXCHANGE;  Surgeon: Corliss Parish, MD;  Location:  [facility];  Service: Ophthalmology;  Laterality: Right;  SF6  . [place]  . MEMBRANE PEEL Right 12/23/2013   Procedure: MEMBRANE PEEL;  Surgeon: Corliss Parish, MD;  Location: [facility];  Service: Ophthalmology;  Laterality: Right;  . PARS PLANA VITRECTOMY Right 10/07/2013   Procedure: PARS PLANA VITRECTOMY WITH 25 GAUGE RIGHT EYE;  Surgeon: Corliss Parish, MD;  Location: [facility];  Service: Ophthalmology;  Laterality: Right;  . PARS PLANA VITRECTOMY Right 12/23/2013   Procedure: PARS PLANA VITRECTOMY WITH 25 GAUGE;  Surgeon: Corliss Parish, MD;  Location: [facility];  Service: Ophthalmology;  Laterality: Right;  . TUBAL LIGATION      Current Outpatient Prescriptions  Medication Sig Dispense Refill  . CALCIUM-VITAMIN D PO Take 1 tablet by mouth daily.    . Multiple Vitamins-Minerals (MULTIVITAMIN WITH MINERALS) tablet Take 1 tablet by mouth daily.      . pantoprazole (PROTONIX) 40 MG tablet Take 1 tablet (40 mg total) by mouth daily as needed (for acid reflux). 90 tablet 3   No current facility-administered medications for this visit.     Family History  Problem Relation Age of Onset  . Hypertension Mother   . Atrial fibrillation Mother   . Deep vein  thrombosis Mother   . Osteoporosis Mother     compression fx  . Thyroid disease Mother   . Osteoarthritis Mother   . Arthritis Mother     shoulder replacement  . Stroke Father   . Anorexia nervosa Sister   . Osteoporosis Sister   . Thyroid disease Sister   . Diabetes Brother   . Cancer Paternal Grandmother     multi-organ    ROS:  Pertinent items are noted in HPI.  Otherwise, a comprehensive ROS was negative.  Exam:   BP (!) 142/84 (BP Location: Right Arm, Patient Position: Sitting, Cuff Size: Normal)   Pulse 68   Ht 5' 3.5" (1.613 m)   Wt 132 lb (59.9 kg)   LMP 01/10/2006   BMI 23.02 kg/m  Height: 5' 3.5" (161.3 cm) Ht Readings from Last 3 Encounters:  10/19/15 5' 3.5" (1.613 m)   03/28/14 5\' 3"  (1.6 m)  12/11/13 5' 3.5" (1.613 m)    General appearance: alert, cooperative and appears stated age Head: Normocephalic, without obvious abnormality, atraumatic Neck: no adenopathy, supple, symmetrical, trachea midline and thyroid normal to inspection and palpation Lungs: clear to auscultation bilaterally Breasts: normal appearance, no masses or tenderness Heart: regular rate and rhythm Abdomen: soft, non-tender; no masses,  no organomegaly Extremities: extremities normal, atraumatic, no cyanosis or edema Skin: Skin color, texture, turgor normal. No rashes or lesions Lymph nodes: Cervical, supraclavicular, and axillary nodes normal. No abnormal inguinal nodes palpated Neurologic: Grossly normal   Pelvic: External genitalia:  no lesions              Urethra:  normal appearing urethra with no masses, tenderness or lesions              Bartholin's and Skene's: normal                 Vagina: normal appearing vagina with normal color and discharge, no lesions              Cervix: anteverted              Pap taken: Yes.   Bimanual Exam:  Uterus:  normal size, contour, position, consistency, mobility, non-tender              Adnexa: no mass, fullness, tenderness               Rectovaginal: Confirms               Anus:  normal sphincter tone, no lesions  Chaperone present: yes  A:  Well Woman with normal exam      Postmenopausal - no HRT               Osteoporosis - conservative management - does not want bisphosphonate's             GERD  Rosacea with current flare   P:   Reviewed health and wellness pertinent to exam  Pap smear as above  Mammogram is due and will schedule will also get BMD  Will get colonoscopy with Dr. Cristina Gong  Will get labs done at PCP including Vit D  Counseled on breast self exam, mammography screening, osteoporosis, adequate intake of calcium and vitamin D, diet and exercise, Kegel's exercises return annually or prn  An After Visit  Summary was printed and given to the patient.

## 2015-10-19 NOTE — Patient Instructions (Addendum)

## 2015-10-20 MED FILL — metroNIDAZOLE 1 % GEL: 1 | 30 days supply | Qty: 60 | Fill #0

## 2015-10-20 NOTE — Progress Notes (Signed)
Encounter reviewed by Dr. Brook Amundson C. Silva.  

## 2015-10-21 LAB — IPS PAP TEST WITH HPV

## 2015-11-23 DIAGNOSIS — H35372 Puckering of macula, left eye: Secondary | ICD-10-CM | POA: Diagnosis not present

## 2015-11-23 DIAGNOSIS — Z961 Presence of intraocular lens: Secondary | ICD-10-CM | POA: Diagnosis not present

## 2015-11-23 DIAGNOSIS — H5203 Hypermetropia, bilateral: Secondary | ICD-10-CM | POA: Diagnosis not present

## 2015-12-14 ENCOUNTER — Other Ambulatory Visit (INDEPENDENT_AMBULATORY_CARE_PROVIDER_SITE_OTHER): Payer: 59

## 2015-12-14 ENCOUNTER — Ambulatory Visit (INDEPENDENT_AMBULATORY_CARE_PROVIDER_SITE_OTHER): Payer: 59 | Admitting: Internal Medicine

## 2015-12-14 ENCOUNTER — Encounter: Payer: Self-pay | Admitting: Internal Medicine

## 2015-12-14 VITALS — BP 132/90 | HR 73 | Temp 98.1°F | Resp 14 | Ht 63.5 in | Wt 134.0 lb

## 2015-12-14 DIAGNOSIS — Z0000 Encounter for general adult medical examination without abnormal findings: Secondary | ICD-10-CM

## 2015-12-14 DIAGNOSIS — Z23 Encounter for immunization: Secondary | ICD-10-CM | POA: Diagnosis not present

## 2015-12-14 DIAGNOSIS — H9312 Tinnitus, left ear: Secondary | ICD-10-CM | POA: Diagnosis not present

## 2015-12-14 DIAGNOSIS — K219 Gastro-esophageal reflux disease without esophagitis: Secondary | ICD-10-CM | POA: Diagnosis not present

## 2015-12-14 LAB — COMPREHENSIVE METABOLIC PANEL
ALT: 15 U/L (ref 0–35)
AST: 17 U/L (ref 0–37)
Albumin: 4.7 g/dL (ref 3.5–5.2)
Alkaline Phosphatase: 47 U/L (ref 39–117)
BUN: 14 mg/dL (ref 6–23)
CO2: 29 mEq/L (ref 19–32)
Calcium: 9.4 mg/dL (ref 8.4–10.5)
Chloride: 103 mEq/L (ref 96–112)
Creatinine, Ser: 0.85 mg/dL (ref 0.40–1.20)
GFR: 72.22 mL/min (ref >60.00)
GLUCOSE: 101 mg/dL — AB (ref 70–99)
POTASSIUM: 4.3 meq/L (ref 3.5–5.1)
SODIUM: 140 meq/L (ref 135–145)
Total Bilirubin: 0.6 mg/dL (ref 0.2–1.2)
Total Protein: 7.8 g/dL (ref 6.0–8.3)

## 2015-12-14 LAB — TSH: TSH: 1.98 u[IU]/mL (ref 0.35–4.50)

## 2015-12-14 LAB — LIPID PANEL
CHOL/HDL RATIO: 2
Cholesterol: 225 mg/dL — ABNORMAL HIGH (ref 0–200)
HDL: 92.1 mg/dL (ref >39.00)
LDL Cholesterol: 105 mg/dL — ABNORMAL HIGH (ref 0–99)
NONHDL: 133.38
TRIGLYCERIDES: 140 mg/dL (ref 0.0–149.0)
VLDL: 28 mg/dL (ref 0.0–40.0)

## 2015-12-14 LAB — CBC
HEMATOCRIT: 42.6 % (ref 36.0–46.0)
HEMOGLOBIN: 14.7 g/dL (ref 12.0–15.0)
MCHC: 34.5 g/dL (ref 30.0–36.0)
MCV: 91.8 fl (ref 78.0–100.0)
Platelets: 251 10*3/uL (ref 150.0–400.0)
RBC: 4.64 Mil/uL (ref 3.87–5.11)
RDW: 13.1 % (ref 11.5–15.5)
WBC: 7.3 10*3/uL (ref 4.0–10.5)

## 2015-12-14 LAB — VITAMIN D 25 HYDROXY (VIT D DEFICIENCY, FRACTURES): VITD: 32.09 ng/mL (ref 30.00–100.00)

## 2015-12-14 MED ORDER — TRIAMCINOLONE ACETONIDE 0.1 % EX CREA: 1.0000 "application " | TOPICAL_CREAM | Freq: Two times a day (BID) | CUTANEOUS | 0 refills | Status: DC

## 2015-12-14 MED ORDER — PANTOPRAZOLE SODIUM 40 MG PO TBEC: 40.0000 mg | DELAYED_RELEASE_TABLET | Freq: Every day | ORAL | 3 refills | Status: DC | PRN

## 2015-12-14 NOTE — Assessment & Plan Note (Signed)
Referral to ENT for second opinion on options. Tried hearing aid about 5-10 years ago which was not helpful then.

## 2015-12-14 NOTE — Assessment & Plan Note (Signed)
Doing well with protonix as needed, not daily.

## 2015-12-14 NOTE — Progress Notes (Signed)
   Subjective:    Patient ID: [name], female    DOB: Dec 09, 1955, 60 y.o.   MRN: [id number]  HPI The patient is a 60 YO female coming in for wellness. No new concerns. Wants second opinion of her tinnitus.   PMH, [facility], social history reviewed and updated.   Review of Systems  Constitutional: Negative.   HENT: Negative.   Eyes: Negative.   Respiratory: Negative for cough, chest tightness and shortness of breath.   Cardiovascular: Negative for chest pain, palpitations and leg swelling.  Gastrointestinal: Negative for abdominal distention, abdominal pain, constipation, diarrhea, nausea and vomiting.  Musculoskeletal: Negative.   Skin: Negative.   Neurological: Negative.   Psychiatric/Behavioral: Negative.       Objective:   Physical Exam  Constitutional: She is oriented to person, place, and time. She appears well-developed and well-nourished.  HENT:  Head: Normocephalic and atraumatic.  Eyes: EOM are normal.  Neck: Normal range of motion.  Cardiovascular: Normal rate and regular rhythm.   Pulmonary/Chest: Effort normal and breath sounds normal. No respiratory distress. She has no wheezes. She has no rales.  Abdominal: Soft. Bowel sounds are normal. She exhibits no distension. There is no tenderness. There is no rebound.  Musculoskeletal: She exhibits no edema.  Neurological: She is alert and oriented to person, place, and time. Coordination normal.  Skin: Skin is warm and dry.  Psychiatric: She has a normal mood and affect.   Vitals:   12/14/15 0857 12/14/15 0938  BP: (!) 148/86 132/90  Pulse: 73   Resp: 14   Temp: 98.1 F (36.7 C)   TempSrc: Oral   SpO2: 98%   Weight: 134 lb (60.8 kg)   Height: 5' 3.5" (1.613 m)    EKG: Rate 64, axis normal, intervals normal, no ST or t wave changes, sinus, when compared to EKG from 2014 no significant changes.     Assessment & Plan:  Shingles shot given at visit.

## 2015-12-14 NOTE — Addendum Note (Signed)
Addended by: Resa Miner R on: 12/14/2015 09:55 AM   Modules accepted: Orders

## 2015-12-14 NOTE — Assessment & Plan Note (Addendum)
EKG done and without change from 2014, she will call for colonoscopy with Dr. Collene Mares. She will get her mammogram done as well. Checking labs and adjust as needed. Exercising some, counseled on sun safety and mole surveillance as well as dangers of distracted driving. Given screening recommendations. Given shingles today, flu and tetanus up to date.

## 2015-12-14 NOTE — Patient Instructions (Signed)
We have checked the EKG and it is the same as before.   We have given you the shingles today.   We are checking the blood work today as well.   Remember to call Dr. Lorie Apley office to get a colonoscopy.   We will get you in with Dr. Thornell Mule for the second opinion about the ears.  Health Maintenance, Female Introduction Adopting a healthy lifestyle and getting preventive care can go a long way to promote health and wellness. Talk with your health care provider about what schedule of regular examinations is right for you. This is a good chance for you to check in with your provider about disease prevention and staying healthy. In between checkups, there are plenty of things you can do on your own. Experts have done a lot of research about which lifestyle changes and preventive measures are most likely to keep you healthy. Ask your health care provider for more information. Weight and diet Eat a healthy diet  Be sure to include plenty of vegetables, fruits, low-fat dairy products, and lean protein.  Do not eat a lot of foods high in solid fats, added sugars, or salt.  Get regular exercise. This is one of the most important things you can do for your health.  Most adults should exercise for at least 150 minutes each week. The exercise should increase your heart rate and make you sweat (moderate-intensity exercise).  Most adults should also do strengthening exercises at least twice a week. This is in addition to the moderate-intensity exercise. Maintain a healthy weight  Body mass index (BMI) is a measurement that can be used to identify possible weight problems. It estimates body fat based on height and weight. Your health care provider can help determine your BMI and help you achieve or maintain a healthy weight.  For females 37 years of age and older:  A BMI below 18.5 is considered underweight.  A BMI of 18.5 to 24.9 is normal.  A BMI of 25 to 29.9 is considered overweight.  A BMI of  30 and above is considered obese. Watch levels of cholesterol and blood lipids  You should start having your blood tested for lipids and cholesterol at 61 years of age, then have this test every 5 years.  You may need to have your cholesterol levels checked more often if:  Your lipid or cholesterol levels are high.  You are older than 61 years of age.  You are at high risk for heart disease. Cancer screening Lung Cancer  Lung cancer screening is recommended for adults 56-4 years old who are at high risk for lung cancer because of a history of smoking.  A yearly low-dose CT scan of the lungs is recommended for people who:  Currently smoke.  Have quit within the past 15 years.  Have at least a 30-pack-year history of smoking. A pack year is smoking an average of one pack of cigarettes a day for 1 year.  Yearly screening should continue until it has been 15 years since you quit.  Yearly screening should stop if you develop a health problem that would prevent you from having lung cancer treatment. Breast Cancer  Practice breast self-awareness. This means understanding how your breasts normally appear and feel.  It also means doing regular breast self-exams. Let your health care provider know about any changes, no matter how small.  If you are in your 20s or 30s, you should have a clinical breast exam (CBE) by a health  care provider every 1-3 years as part of a regular health exam.  If you are 48 or older, have a CBE every year. Also consider having a breast X-ray (mammogram) every year.  If you have a family history of breast cancer, talk to your health care provider about genetic screening.  If you are at high risk for breast cancer, talk to your health care provider about having an MRI and a mammogram every year.  Breast cancer gene (BRCA) assessment is recommended for women who have family members with BRCA-related cancers. BRCA-related cancers  include:  Breast.  Ovarian.  Tubal.  Peritoneal cancers.  Results of the assessment will determine the need for genetic counseling and BRCA1 and BRCA2 testing. Cervical Cancer  Your health care provider may recommend that you be screened regularly for cancer of the pelvic organs (ovaries, uterus, and vagina). This screening involves a pelvic examination, including checking for microscopic changes to the surface of your cervix (Pap test). You may be encouraged to have this screening done every 3 years, beginning at age 48.  For women ages 68-65, health care providers may recommend pelvic exams and Pap testing every 3 years, or they may recommend the Pap and pelvic exam, combined with testing for human papilloma virus (HPV), every 5 years. Some types of HPV increase your risk of cervical cancer. Testing for HPV may also be done on women of any age with unclear Pap test results.  Other health care providers may not recommend any screening for nonpregnant women who are considered low risk for pelvic cancer and who do not have symptoms. Ask your health care provider if a screening pelvic exam is right for you.  If you have had past treatment for cervical cancer or a condition that could lead to cancer, you need Pap tests and screening for cancer for at least 20 years after your treatment. If Pap tests have been discontinued, your risk factors (such as having a new sexual partner) need to be reassessed to determine if screening should resume. Some women have medical problems that increase the chance of getting cervical cancer. In these cases, your health care provider may recommend more frequent screening and Pap tests. Colorectal Cancer  This type of cancer can be detected and often prevented.  Routine colorectal cancer screening usually begins at 61 years of age and continues through 61 years of age.  Your health care provider may recommend screening at an earlier age if you have risk factors  for colon cancer.  Your health care provider may also recommend using home test kits to check for hidden blood in the stool.  A small camera at the end of a tube can be used to examine your colon directly (sigmoidoscopy or colonoscopy). This is done to check for the earliest forms of colorectal cancer.  Routine screening usually begins at age 35.  Direct examination of the colon should be repeated every 5-10 years through 61 years of age. However, you may need to be screened more often if early forms of precancerous polyps or small growths are found. Skin Cancer  Check your skin from head to toe regularly.  Tell your health care provider about any new moles or changes in moles, especially if there is a change in a mole's shape or color.  Also tell your health care provider if you have a mole that is larger than the size of a pencil eraser.  Always use sunscreen. Apply sunscreen liberally and repeatedly throughout the day.  Protect yourself by wearing long sleeves, pants, a wide-brimmed hat, and sunglasses whenever you are outside. Heart disease, diabetes, and high blood pressure  High blood pressure causes heart disease and increases the risk of stroke. High blood pressure is more likely to develop in:  People who have blood pressure in the high end of the normal range (130-139/85-89 mm Hg).  People who are overweight or obese.  People who are African American.  If you are 73-52 years of age, have your blood pressure checked every 3-5 years. If you are 27 years of age or older, have your blood pressure checked every year. You should have your blood pressure measured twice-once when you are at a hospital or clinic, and once when you are not at a hospital or clinic. Record the average of the two measurements. To check your blood pressure when you are not at a hospital or clinic, you can use:  An automated blood pressure machine at a pharmacy.  A home blood pressure monitor.  If you  are between 24 years and 57 years old, ask your health care provider if you should take aspirin to prevent strokes.  Have regular diabetes screenings. This involves taking a blood sample to check your fasting blood sugar level.  If you are at a normal weight and have a low risk for diabetes, have this test once every three years after 61 years of age.  If you are overweight and have a high risk for diabetes, consider being tested at a younger age or more often. Preventing infection Hepatitis B  If you have a higher risk for hepatitis B, you should be screened for this virus. You are considered at high risk for hepatitis B if:  You were born in a country where hepatitis B is common. Ask your health care provider which countries are considered high risk.  Your parents were born in a high-risk country, and you have not been immunized against hepatitis B (hepatitis B vaccine).  You have HIV or AIDS.  You use needles to inject street drugs.  You live with someone who has hepatitis B.  You have had sex with someone who has hepatitis B.  You get hemodialysis treatment.  You take certain medicines for conditions, including cancer, organ transplantation, and autoimmune conditions. Hepatitis C  Blood testing is recommended for:  Everyone born from [birth year] through 1966.  Anyone with known risk factors for hepatitis C. Sexually transmitted infections (STIs)  You should be screened for sexually transmitted infections (STIs) including gonorrhea and chlamydia if:  You are sexually active and are younger than 61 years of age.  You are older than 61 years of age and your health care provider tells you that you are at risk for this type of infection.  Your sexual activity has changed since you were last screened and you are at an increased risk for chlamydia or gonorrhea. Ask your health care provider if you are at risk.  If you do not have HIV, but are at risk, it may be recommended that you  take a prescription medicine daily to prevent HIV infection. This is called pre-exposure prophylaxis (PrEP). You are considered at risk if:  You are sexually active and do not regularly use condoms or know the HIV status of your partner(s).  You take drugs by injection.  You are sexually active with a partner who has HIV. Talk with your health care provider about whether you are at high risk of being infected with HIV.  If you choose to begin PrEP, you should first be tested for HIV. You should then be tested every 3 months for as long as you are taking PrEP. Pregnancy  If you are premenopausal and you may become pregnant, ask your health care provider about preconception counseling.  If you may become pregnant, take 400 to 800 micrograms (mcg) of folic acid every day.  If you want to prevent pregnancy, talk to your health care provider about birth control (contraception). Osteoporosis and menopause  Osteoporosis is a disease in which the bones lose minerals and strength with aging. This can result in serious bone fractures. Your risk for osteoporosis can be identified using a bone density scan.  If you are 47 years of age or older, or if you are at risk for osteoporosis and fractures, ask your health care provider if you should be screened.  Ask your health care provider whether you should take a calcium or vitamin D supplement to lower your risk for osteoporosis.  Menopause may have certain physical symptoms and risks.  Hormone replacement therapy may reduce some of these symptoms and risks. Talk to your health care provider about whether hormone replacement therapy is right for you. Follow these instructions at home:  Schedule regular health, dental, and eye exams.  Stay current with your immunizations.  Do not use any tobacco products including cigarettes, chewing tobacco, or electronic cigarettes.  If you are pregnant, do not drink alcohol.  If you are breastfeeding, limit  how much and how often you drink alcohol.  Limit alcohol intake to no more than 1 drink per day for nonpregnant women. One drink equals 12 ounces of beer, 5 ounces of wine, or 1 ounces of hard liquor.  Do not use street drugs.  Do not share needles.  Ask your health care provider for help if you need support or information about quitting drugs.  Tell your health care provider if you often feel depressed.  Tell your health care provider if you have ever been abused or do not feel safe at home. This information is not intended to replace advice given to you by your health care provider. Make sure you discuss any questions you have with your health care provider. Document Released: 07/12/2010 Document Revised: 06/04/2015 Document Reviewed: 09/30/2014  2017 [employer]

## 2015-12-14 NOTE — Progress Notes (Signed)
Pre visit review using our clinic review tool, if applicable. No additional management support is needed unless otherwise documented below in the visit note. 

## 2016-01-20 DIAGNOSIS — H903 Sensorineural hearing loss, bilateral: Secondary | ICD-10-CM | POA: Diagnosis not present

## 2016-01-20 DIAGNOSIS — H9312 Tinnitus, left ear: Secondary | ICD-10-CM | POA: Diagnosis not present

## 2016-01-20 DIAGNOSIS — H8102 Meniere's disease, left ear: Secondary | ICD-10-CM | POA: Diagnosis not present

## 2016-01-20 MED FILL — CHLORTHALIDONE 50 MG TABLET: 50 | 30 days supply | Qty: 30 | Fill #0

## 2016-01-20 MED FILL — PANTOPRAZOLE SOD DR 40 MG T: 40 | 90 days supply | Qty: 90 | Fill #0

## 2016-02-01 DIAGNOSIS — H8102 Meniere's disease, left ear: Secondary | ICD-10-CM | POA: Diagnosis not present

## 2016-02-01 DIAGNOSIS — H9312 Tinnitus, left ear: Secondary | ICD-10-CM | POA: Diagnosis not present

## 2016-02-01 DIAGNOSIS — H903 Sensorineural hearing loss, bilateral: Secondary | ICD-10-CM | POA: Diagnosis not present

## 2016-02-08 DIAGNOSIS — H903 Sensorineural hearing loss, bilateral: Secondary | ICD-10-CM | POA: Diagnosis not present

## 2016-02-08 DIAGNOSIS — H8102 Meniere's disease, left ear: Secondary | ICD-10-CM | POA: Diagnosis not present

## 2016-02-08 DIAGNOSIS — H9312 Tinnitus, left ear: Secondary | ICD-10-CM | POA: Diagnosis not present

## 2016-02-24 DIAGNOSIS — S46811A Strain of other muscles, fascia and tendons at shoulder and upper arm level, right arm, initial encounter: Secondary | ICD-10-CM | POA: Diagnosis not present

## 2016-03-01 DIAGNOSIS — H903 Sensorineural hearing loss, bilateral: Secondary | ICD-10-CM | POA: Diagnosis not present

## 2016-03-01 DIAGNOSIS — H8102 Meniere's disease, left ear: Secondary | ICD-10-CM | POA: Diagnosis not present

## 2016-03-01 DIAGNOSIS — H9312 Tinnitus, left ear: Secondary | ICD-10-CM | POA: Diagnosis not present

## 2016-03-01 MED FILL — CHLORTHALIDONE 50 MG TABLET: 50 | 30 days supply | Qty: 30 | Fill #1

## 2016-03-16 ENCOUNTER — Other Ambulatory Visit: Payer: Self-pay | Admitting: Nurse Practitioner

## 2016-03-16 ENCOUNTER — Other Ambulatory Visit (HOSPITAL_COMMUNITY): Payer: Self-pay | Admitting: Orthopedic Surgery

## 2016-03-16 DIAGNOSIS — Z1231 Encounter for screening mammogram for malignant neoplasm of breast: Secondary | ICD-10-CM

## 2016-03-16 DIAGNOSIS — S46911A Strain of unspecified muscle, fascia and tendon at shoulder and upper arm level, right arm, initial encounter: Secondary | ICD-10-CM

## 2016-03-21 ENCOUNTER — Ambulatory Visit (HOSPITAL_COMMUNITY)
Admission: RE | Admit: 2016-03-21 | Discharge: 2016-03-21 | Disposition: A | Discharge status: Home / Self Care | Payer: 59 | Source: Ambulatory Visit | Attending: Orthopedic Surgery | Admitting: Orthopedic Surgery

## 2016-03-21 DIAGNOSIS — S46911A Strain of unspecified muscle, fascia and tendon at shoulder and upper arm level, right arm, initial encounter: Secondary | ICD-10-CM | POA: Insufficient documentation

## 2016-03-21 DIAGNOSIS — X58XXXA Exposure to other specified factors, initial encounter: Secondary | ICD-10-CM | POA: Diagnosis not present

## 2016-03-21 DIAGNOSIS — M25411 Effusion, right shoulder: Secondary | ICD-10-CM | POA: Diagnosis not present

## 2016-03-21 DIAGNOSIS — M7581 Other shoulder lesions, right shoulder: Secondary | ICD-10-CM | POA: Insufficient documentation

## 2016-03-21 DIAGNOSIS — M7551 Bursitis of right shoulder: Secondary | ICD-10-CM | POA: Diagnosis not present

## 2016-03-23 DIAGNOSIS — M7541 Impingement syndrome of right shoulder: Secondary | ICD-10-CM | POA: Diagnosis not present

## 2016-04-07 DIAGNOSIS — Z1211 Encounter for screening for malignant neoplasm of colon: Secondary | ICD-10-CM | POA: Diagnosis not present

## 2016-04-07 DIAGNOSIS — K219 Gastro-esophageal reflux disease without esophagitis: Secondary | ICD-10-CM | POA: Diagnosis not present

## 2016-04-07 DIAGNOSIS — K5730 Diverticulosis of large intestine without perforation or abscess without bleeding: Secondary | ICD-10-CM | POA: Diagnosis not present

## 2016-04-07 DIAGNOSIS — Z8371 Family history of colonic polyps: Secondary | ICD-10-CM | POA: Diagnosis not present

## 2016-04-27 ENCOUNTER — Ambulatory Visit
Admission: RE | Admit: 2016-04-27 | Discharge: 2016-04-27 | Disposition: A | Discharge status: Home / Self Care | Payer: 59 | Source: Ambulatory Visit | Attending: Nurse Practitioner | Admitting: Nurse Practitioner

## 2016-04-27 DIAGNOSIS — M7541 Impingement syndrome of right shoulder: Secondary | ICD-10-CM | POA: Diagnosis not present

## 2016-04-27 DIAGNOSIS — Z1231 Encounter for screening mammogram for malignant neoplasm of breast: Secondary | ICD-10-CM

## 2016-04-27 DIAGNOSIS — M810 Age-related osteoporosis without current pathological fracture: Secondary | ICD-10-CM | POA: Diagnosis not present

## 2016-04-27 DIAGNOSIS — E2839 Other primary ovarian failure: Secondary | ICD-10-CM

## 2016-04-27 DIAGNOSIS — Z780 Asymptomatic menopausal state: Secondary | ICD-10-CM | POA: Diagnosis not present

## 2016-05-02 MED FILL — CHLORTHALIDONE 50 MG TABLET: 50 | 30 days supply | Qty: 30 | Fill #2

## 2016-05-04 MED FILL — GAVILYTE-G SOLUTION: 236 | 2 days supply | Qty: 4000 | Fill #0

## 2016-05-11 DIAGNOSIS — D122 Benign neoplasm of ascending colon: Secondary | ICD-10-CM | POA: Diagnosis not present

## 2016-05-11 DIAGNOSIS — K635 Polyp of colon: Secondary | ICD-10-CM | POA: Diagnosis not present

## 2016-05-11 DIAGNOSIS — D120 Benign neoplasm of cecum: Secondary | ICD-10-CM | POA: Diagnosis not present

## 2016-05-11 DIAGNOSIS — Z8371 Family history of colonic polyps: Secondary | ICD-10-CM | POA: Diagnosis not present

## 2016-05-11 DIAGNOSIS — Z1211 Encounter for screening for malignant neoplasm of colon: Secondary | ICD-10-CM | POA: Diagnosis not present

## 2016-05-11 DIAGNOSIS — D125 Benign neoplasm of sigmoid colon: Secondary | ICD-10-CM | POA: Diagnosis not present

## 2016-06-20 MED FILL — PANTOPRAZOLE SOD DR 40 MG T: 40 | 90 days supply | Qty: 90 | Fill #1

## 2016-06-20 MED FILL — CHLORTHALIDONE 50 MG TABLET: 50 | 30 days supply | Qty: 30 | Fill #0

## 2016-08-01 ENCOUNTER — Telehealth: Payer: Self-pay | Admitting: Obstetrics and Gynecology

## 2016-08-01 NOTE — Telephone Encounter (Signed)
LMTCB/:NP/ .CX/LETTER SENT/RD

## 2016-10-24 ENCOUNTER — Ambulatory Visit: Payer: 59 | Admitting: Nurse Practitioner

## 2016-11-08 MED FILL — PANTOPRAZOLE SOD DR 40 MG T: 40 | 90 days supply | Qty: 90 | Fill #2

## 2016-11-24 DIAGNOSIS — H524 Presbyopia: Secondary | ICD-10-CM | POA: Diagnosis not present

## 2016-12-19 ENCOUNTER — Encounter: Payer: 59 | Admitting: Internal Medicine

## 2017-01-30 MED FILL — CHLORTHALIDONE 50 MG TABS: 50 | 30 days supply | Qty: 30 | Fill #1

## 2017-03-20 ENCOUNTER — Encounter: Payer: Self-pay | Admitting: Internal Medicine

## 2017-03-20 ENCOUNTER — Ambulatory Visit (INDEPENDENT_AMBULATORY_CARE_PROVIDER_SITE_OTHER): Payer: 59 | Admitting: Internal Medicine

## 2017-03-20 ENCOUNTER — Other Ambulatory Visit (INDEPENDENT_AMBULATORY_CARE_PROVIDER_SITE_OTHER): Payer: 59

## 2017-03-20 VITALS — BP 112/80 | HR 73 | Temp 98.1°F | Ht 63.5 in | Wt 135.0 lb

## 2017-03-20 DIAGNOSIS — Z0000 Encounter for general adult medical examination without abnormal findings: Secondary | ICD-10-CM

## 2017-03-20 DIAGNOSIS — K219 Gastro-esophageal reflux disease without esophagitis: Secondary | ICD-10-CM | POA: Diagnosis not present

## 2017-03-20 DIAGNOSIS — Z23 Encounter for immunization: Secondary | ICD-10-CM | POA: Diagnosis not present

## 2017-03-20 LAB — COMPREHENSIVE METABOLIC PANEL
ALBUMIN: 4.3 g/dL (ref 3.5–5.2)
ALT: 11 U/L (ref 0–35)
AST: 16 U/L (ref 0–37)
Alkaline Phosphatase: 40 U/L (ref 39–117)
BUN: 14 mg/dL (ref 6–23)
CALCIUM: 9.3 mg/dL (ref 8.4–10.5)
CHLORIDE: 100 meq/L (ref 96–112)
CO2: 30 meq/L (ref 19–32)
CREATININE: 0.79 mg/dL (ref 0.40–1.20)
GFR: 78.26 mL/min (ref >60.00)
Glucose, Bld: 105 mg/dL — ABNORMAL HIGH (ref 70–99)
POTASSIUM: 3.7 meq/L (ref 3.5–5.1)
SODIUM: 139 meq/L (ref 135–145)
Total Bilirubin: 0.5 mg/dL (ref 0.2–1.2)
Total Protein: 7.1 g/dL (ref 6.0–8.3)

## 2017-03-20 LAB — TSH: TSH: 3.04 u[IU]/mL (ref 0.35–4.50)

## 2017-03-20 LAB — LIPID PANEL
CHOL/HDL RATIO: 3
CHOLESTEROL: 179 mg/dL (ref 0–200)
HDL: 68.6 mg/dL (ref >39.00)
LDL Cholesterol: 85 mg/dL (ref 0–99)
NonHDL: 110.14
TRIGLYCERIDES: 127 mg/dL (ref 0.0–149.0)
VLDL: 25.4 mg/dL (ref 0.0–40.0)

## 2017-03-20 LAB — CBC
HEMATOCRIT: 40.4 % (ref 36.0–46.0)
Hemoglobin: 14.2 g/dL (ref 12.0–15.0)
MCHC: 35.1 g/dL (ref 30.0–36.0)
MCV: 90.9 fl (ref 78.0–100.0)
Platelets: 198 10*3/uL (ref 150.0–400.0)
RBC: 4.45 Mil/uL (ref 3.87–5.11)
RDW: 12.7 % (ref 11.5–15.5)
WBC: 6.8 10*3/uL (ref 4.0–10.5)

## 2017-03-20 LAB — VITAMIN D 25 HYDROXY (VIT D DEFICIENCY, FRACTURES): VITD: 24.06 ng/mL — AB (ref 30.00–100.00)

## 2017-03-20 NOTE — Assessment & Plan Note (Signed)
Protonix taking every other day with reasonable control. She definitely has dietary triggers and tries to avoid.

## 2017-03-20 NOTE — Patient Instructions (Signed)
We have given you the tetanus shot today. We will add you to the shingles waiting list.   Health Maintenance, Female Adopting a healthy lifestyle and getting preventive care can go a long way to promote health and wellness. Talk with your health care provider about what schedule of regular examinations is right for you. This is a good chance for you to check in with your provider about disease prevention and staying healthy. In between checkups, there are plenty of things you can do on your own. Experts have done a lot of research about which lifestyle changes and preventive measures are most likely to keep you healthy. Ask your health care provider for more information. Weight and diet Eat a healthy diet  Be sure to include plenty of vegetables, fruits, low-fat dairy products, and lean protein.  Do not eat a lot of foods high in solid fats, added sugars, or salt.  Get regular exercise. This is one of the most important things you can do for your health. ? Most adults should exercise for at least 150 minutes each week. The exercise should increase your heart rate and make you sweat (moderate-intensity exercise). ? Most adults should also do strengthening exercises at least twice a week. This is in addition to the moderate-intensity exercise.  Maintain a healthy weight  Body mass index (BMI) is a measurement that can be used to identify possible weight problems. It estimates body fat based on height and weight. Your health care provider can help determine your BMI and help you achieve or maintain a healthy weight.  For females 71 years of age and older: ? A BMI below 18.5 is considered underweight. ? A BMI of 18.5 to 24.9 is normal. ? A BMI of 25 to 29.9 is considered overweight. ? A BMI of 30 and above is considered obese.  Watch levels of cholesterol and blood lipids  You should start having your blood tested for lipids and cholesterol at 63 years of age, then have this test every 5  years.  You may need to have your cholesterol levels checked more often if: ? Your lipid or cholesterol levels are high. ? You are older than 63 years of age. ? You are at high risk for heart disease.  Cancer screening Lung Cancer  Lung cancer screening is recommended for adults 11-11 years old who are at high risk for lung cancer because of a history of smoking.  A yearly low-dose CT scan of the lungs is recommended for people who: ? Currently smoke. ? Have quit within the past 15 years. ? Have at least a 30-pack-year history of smoking. A pack year is smoking an average of one pack of cigarettes a day for 1 year.  Yearly screening should continue until it has been 15 years since you quit.  Yearly screening should stop if you develop a health problem that would prevent you from having lung cancer treatment.  Breast Cancer  Practice breast self-awareness. This means understanding how your breasts normally appear and feel.  It also means doing regular breast self-exams. Let your health care provider know about any changes, no matter how small.  If you are in your 20s or 30s, you should have a clinical breast exam (CBE) by a health care provider every 1-3 years as part of a regular health exam.  If you are 39 or older, have a CBE every year. Also consider having a breast X-ray (mammogram) every year.  If you have a family history  of breast cancer, talk to your health care provider about genetic screening.  If you are at high risk for breast cancer, talk to your health care provider about having an MRI and a mammogram every year.  Breast cancer gene (BRCA) assessment is recommended for women who have family members with BRCA-related cancers. BRCA-related cancers include: ? Breast. ? Ovarian. ? Tubal. ? Peritoneal cancers.  Results of the assessment will determine the need for genetic counseling and BRCA1 and BRCA2 testing.  Cervical Cancer Your health care provider may  recommend that you be screened regularly for cancer of the pelvic organs (ovaries, uterus, and vagina). This screening involves a pelvic examination, including checking for microscopic changes to the surface of your cervix (Pap test). You may be encouraged to have this screening done every 3 years, beginning at age 21.  For women ages 30-65, health care providers may recommend pelvic exams and Pap testing every 3 years, or they may recommend the Pap and pelvic exam, combined with testing for human papilloma virus (HPV), every 5 years. Some types of HPV increase your risk of cervical cancer. Testing for HPV may also be done on women of any age with unclear Pap test results.  Other health care providers may not recommend any screening for nonpregnant women who are considered low risk for pelvic cancer and who do not have symptoms. Ask your health care provider if a screening pelvic exam is right for you.  If you have had past treatment for cervical cancer or a condition that could lead to cancer, you need Pap tests and screening for cancer for at least 20 years after your treatment. If Pap tests have been discontinued, your risk factors (such as having a new sexual partner) need to be reassessed to determine if screening should resume. Some women have medical problems that increase the chance of getting cervical cancer. In these cases, your health care provider may recommend more frequent screening and Pap tests.  Colorectal Cancer  This type of cancer can be detected and often prevented.  Routine colorectal cancer screening usually begins at 63 years of age and continues through 63 years of age.  Your health care provider may recommend screening at an earlier age if you have risk factors for colon cancer.  Your health care provider may also recommend using home test kits to check for hidden blood in the stool.  A small camera at the end of a tube can be used to examine your colon directly  (sigmoidoscopy or colonoscopy). This is done to check for the earliest forms of colorectal cancer.  Routine screening usually begins at age 50.  Direct examination of the colon should be repeated every 5-10 years through 63 years of age. However, you may need to be screened more often if early forms of precancerous polyps or small growths are found.  Skin Cancer  Check your skin from head to toe regularly.  Tell your health care provider about any new moles or changes in moles, especially if there is a change in a mole's shape or color.  Also tell your health care provider if you have a mole that is larger than the size of a pencil eraser.  Always use sunscreen. Apply sunscreen liberally and repeatedly throughout the day.  Protect yourself by wearing long sleeves, pants, a wide-brimmed hat, and sunglasses whenever you are outside.  Heart disease, diabetes, and high blood pressure  High blood pressure causes heart disease and increases the risk of stroke. High   blood pressure is more likely to develop in: ? People who have blood pressure in the high end of the normal range (130-139/85-89 mm Hg). ? People who are overweight or obese. ? People who are African American.  If you are 18-39 years of age, have your blood pressure checked every 3-5 years. If you are 40 years of age or older, have your blood pressure checked every year. You should have your blood pressure measured twice-once when you are at a hospital or clinic, and once when you are not at a hospital or clinic. Record the average of the two measurements. To check your blood pressure when you are not at a hospital or clinic, you can use: ? An automated blood pressure machine at a pharmacy. ? A home blood pressure monitor.  If you are between 55 years and 79 years old, ask your health care provider if you should take aspirin to prevent strokes.  Have regular diabetes screenings. This involves taking a blood sample to check your  fasting blood sugar level. ? If you are at a normal weight and have a low risk for diabetes, have this test once every three years after 63 years of age. ? If you are overweight and have a high risk for diabetes, consider being tested at a younger age or more often. Preventing infection Hepatitis B  If you have a higher risk for hepatitis B, you should be screened for this virus. You are considered at high risk for hepatitis B if: ? You were born in a country where hepatitis B is common. Ask your health care provider which countries are considered high risk. ? Your parents were born in a high-risk country, and you have not been immunized against hepatitis B (hepatitis B vaccine). ? You have HIV or AIDS. ? You use needles to inject street drugs. ? You live with someone who has hepatitis B. ? You have had sex with someone who has hepatitis B. ? You get hemodialysis treatment. ? You take certain medicines for conditions, including cancer, organ transplantation, and autoimmune conditions.  Hepatitis C  Blood testing is recommended for: ? Everyone born from 1945 through 1965. ? Anyone with known risk factors for hepatitis C.  Sexually transmitted infections (STIs)  You should be screened for sexually transmitted infections (STIs) including gonorrhea and chlamydia if: ? You are sexually active and are younger than 63 years of age. ? You are older than 63 years of age and your health care provider tells you that you are at risk for this type of infection. ? Your sexual activity has changed since you were last screened and you are at an increased risk for chlamydia or gonorrhea. Ask your health care provider if you are at risk.  If you do not have HIV, but are at risk, it may be recommended that you take a prescription medicine daily to prevent HIV infection. This is called pre-exposure prophylaxis (PrEP). You are considered at risk if: ? You are sexually active and do not regularly use condoms  or know the HIV status of your partner(s). ? You take drugs by injection. ? You are sexually active with a partner who has HIV.  Talk with your health care provider about whether you are at high risk of being infected with HIV. If you choose to begin PrEP, you should first be tested for HIV. You should then be tested every 3 months for as long as you are taking PrEP. Pregnancy  If you are   premenopausal and you may become pregnant, ask your health care provider about preconception counseling.  If you may become pregnant, take 400 to 800 micrograms (mcg) of folic acid every day.  If you want to prevent pregnancy, talk to your health care provider about birth control (contraception). Osteoporosis and menopause  Osteoporosis is a disease in which the bones lose minerals and strength with aging. This can result in serious bone fractures. Your risk for osteoporosis can be identified using a bone density scan.  If you are 41 years of age or older, or if you are at risk for osteoporosis and fractures, ask your health care provider if you should be screened.  Ask your health care provider whether you should take a calcium or vitamin D supplement to lower your risk for osteoporosis.  Menopause may have certain physical symptoms and risks.  Hormone replacement therapy may reduce some of these symptoms and risks. Talk to your health care provider about whether hormone replacement therapy is right for you. Follow these instructions at home:  Schedule regular health, dental, and eye exams.  Stay current with your immunizations.  Do not use any tobacco products including cigarettes, chewing tobacco, or electronic cigarettes.  If you are pregnant, do not drink alcohol.  If you are breastfeeding, limit how much and how often you drink alcohol.  Limit alcohol intake to no more than 1 drink per day for nonpregnant women. One drink equals 12 ounces of beer, 5 ounces of wine, or 1 ounces of hard  liquor.  Do not use street drugs.  Do not share needles.  Ask your health care provider for help if you need support or information about quitting drugs.  Tell your health care provider if you often feel depressed.  Tell your health care provider if you have ever been abused or do not feel safe at home. This information is not intended to replace advice given to you by your health care provider. Make sure you discuss any questions you have with your health care provider. Document Released: 07/12/2010 Document Revised: 06/04/2015 Document Reviewed: 09/30/2014 Elsevier Interactive Patient Education  [employer].

## 2017-03-20 NOTE — Progress Notes (Signed)
   Subjective:    Patient ID: [name], female    DOB: 08/26/1955, 62 y.o.   MRN: [id number]  HPI The patient is a 62 YO female coming in for physical.   PMH, [facility], social history reviewed and updated.   Review of Systems  Constitutional: Negative.   HENT: Negative.   Eyes: Negative.   Respiratory: Negative for cough, chest tightness and shortness of breath.   Cardiovascular: Negative for chest pain, palpitations and leg swelling.  Gastrointestinal: Negative for abdominal distention, abdominal pain, constipation, diarrhea, nausea and vomiting.  Musculoskeletal: Negative.   Skin: Negative.   Neurological: Negative.   Psychiatric/Behavioral: Negative.       Objective:   Physical Exam  Constitutional: She is oriented to person, place, and time. She appears well-developed and well-nourished.  HENT:  Head: Normocephalic and atraumatic.  Eyes: EOM are normal.  Neck: Normal range of motion.  Cardiovascular: Normal rate and regular rhythm.  Pulmonary/Chest: Effort normal and breath sounds normal. No respiratory distress. She has no wheezes. She has no rales.  Abdominal: Soft. Bowel sounds are normal. She exhibits no distension. There is no tenderness. There is no rebound.  Musculoskeletal: She exhibits no edema.  Neurological: She is alert and oriented to person, place, and time. Coordination normal.  Skin: Skin is warm and dry.  Psychiatric: She has a normal mood and affect.   Vitals:   03/20/17 0820  BP: 112/80  Pulse: 73  Temp: 98.1 F (36.7 C)  TempSrc: Oral  SpO2: 97%  Weight: 135 lb (61.2 kg)  Height: 5' 3.5" (1.613 m)      Assessment & Plan:  Tdap given at visit

## 2017-03-20 NOTE — Assessment & Plan Note (Signed)
Tdap given at visit, added to shingrix waiting list. Flu and mammogram and colonoscopy and dexa up to date. Counseled about sun safety and mole surveillance. Given screening recommendations.

## 2017-03-27 ENCOUNTER — Encounter: Payer: Self-pay | Admitting: Internal Medicine

## 2017-03-27 MED ORDER — FLUTICASONE PROPIONATE 50 MCG/ACT NA SUSP: 2.0000 | Freq: Every day | NASAL | 6 refills | Status: DC

## 2017-03-27 MED FILL — FLUTICASONE PROP 50 MCG SPR: 50 | 30 days supply | Qty: 16 | Fill #0

## 2017-04-06 ENCOUNTER — Encounter: Payer: Self-pay | Admitting: Obstetrics and Gynecology

## 2017-04-06 ENCOUNTER — Other Ambulatory Visit: Payer: Self-pay

## 2017-04-06 ENCOUNTER — Ambulatory Visit (INDEPENDENT_AMBULATORY_CARE_PROVIDER_SITE_OTHER): Payer: 59 | Admitting: Obstetrics and Gynecology

## 2017-04-06 VITALS — BP 114/66 | HR 80 | Resp 14 | Ht 63.5 in | Wt 138.6 lb

## 2017-04-06 DIAGNOSIS — M810 Age-related osteoporosis without current pathological fracture: Secondary | ICD-10-CM | POA: Diagnosis not present

## 2017-04-06 DIAGNOSIS — E559 Vitamin D deficiency, unspecified: Secondary | ICD-10-CM | POA: Insufficient documentation

## 2017-04-06 DIAGNOSIS — Z01419 Encounter for gynecological examination (general) (routine) without abnormal findings: Secondary | ICD-10-CM

## 2017-04-06 NOTE — Progress Notes (Signed)
62 y.o. G0P0000 SingleCaucasianF here for annual exam.  No bleeding. No bleeding. Not sexually active. She has a live partner (he is a retired Pharmacist, hospital), they are not sexually active.  She has a h/o osteoporosis, no treatment. Has a h/o GERD.     Patient's last menstrual period was 01/10/2006.          Sexually active: No.  The current method of family planning is tubal ligation.    Exercising: Yes  work, housework, yardwork Smoker:  no  Health Maintenance: Pap: 10-19-15 Neg:Neg HR HPV, 06-11-11 Neg History of abnormal Pap:  no MMG:  04-27-16 [id number] Colonoscopy:05-10-16 polyps;next 5 years BMD:   04-27-16 Osteoporosis. T score of -2.7 in the left hip. No statistically significant change since her prior BMD in 5/11 TDaP:  03-20-17 Gardasil: no   reports that she has never smoked. She has never used smokeless tobacco. She reports that she drinks alcohol. She reports that she does not use drugs. 1.5 bottles of wine a week. She is a Community education officer at [employer].   Past Medical History:  Diagnosis Date  . GERD (gastroesophageal reflux disease)    takes Omeprazole and Protonix daily  . H/O hiatal hernia   . History of bronchitis    15+yrs ago  . History of colon polyps   . History of vertigo    takes Antivert daily as needed  . Joint pain   . Meniere's disease    L  hearing loss related to same  . Osteoporosis   . PMB (postmenopausal bleeding) 12/07   proliferative endo. No HRT, PUS normal    Past Surgical History:  Procedure Laterality Date  . ABSCESS DRAINAGE  09/2010   suprapubic  . BTSP  1994  . CATARACT EXTRACTION EXTRACAPSULAR Right 10/2014  . COLONOSCOPY W/ BIOPSIES  12/12/05   hyperplastic polyp, recheck in 10 years  . CYSTECTOMY     from buttock  . GAS/FLUID EXCHANGE Right 12/23/2013   Procedure: GAS/FLUID EXCHANGE;  Surgeon: Corliss Parish, MD;  Location: [place];  Service: Ophthalmology;  Laterality: Right;  SF6  . [place]  . MEMBRANE PEEL Right 12/23/2013   Procedure: MEMBRANE PEEL;  Surgeon: Corliss Parish, MD;  Location: [place];  Service: Ophthalmology;  Laterality: Right;  . PARS PLANA VITRECTOMY Right 10/07/2013   Procedure: PARS PLANA VITRECTOMY WITH 25 GAUGE RIGHT EYE;  Surgeon: Corliss Parish, MD;  Location: [place];  Service: Ophthalmology;  Laterality: Right;  . PARS PLANA VITRECTOMY Right 12/23/2013   Procedure: PARS PLANA VITRECTOMY WITH 25 GAUGE;  Surgeon: Corliss Parish, MD;  Location: [place];  Service: Ophthalmology;  Laterality: Right;  . TUBAL LIGATION      Current Outpatient Medications  Medication Sig Dispense Refill  . chlorthalidone (HYGROTON) 50 MG tablet   2  . fluticasone (FLONASE) 50 MCG/ACT nasal spray Place 2 sprays into both nostrils daily. 16 g 6  . metroNIDAZOLE (METROGEL) 1 % gel Apply topically daily. 45 g 0  . Multiple Vitamins-Minerals (MULTIVITAMIN WITH MINERALS) tablet Take 1 tablet by mouth daily.      . pantoprazole (PROTONIX) 40 MG tablet Take 1 tablet (40 mg total) by mouth daily as needed (for acid reflux). 90 tablet 3   No current facility-administered medications for this visit.   Diuretic is for the pressure in her ear.   Family History  Problem Relation Age of Onset  . Hypertension Mother   .  Atrial fibrillation Mother   . Deep vein thrombosis Mother   . Osteoporosis Mother        compression fx  . Thyroid disease Mother   . Osteoarthritis Mother   . Arthritis Mother        shoulder replacement  . Stroke Father   . Anorexia nervosa Sister   . Osteoporosis Sister   . Thyroid disease Sister   . Diabetes Brother   . Cancer Paternal Grandmother        multi-organ    Review of Systems  Constitutional: Negative.   HENT: Positive for hearing loss.   Eyes: Negative.   Respiratory: Negative.   Cardiovascular: Negative.   Gastrointestinal: Negative.   Endocrine: Negative.   Genitourinary: Negative.   Musculoskeletal: Negative.   Skin: Negative.    Allergic/Immunologic: Negative.   Neurological: Negative.   Psychiatric/Behavioral: Negative.     Exam:   BP 114/66 (BP Location: Right Arm, Patient Position: Sitting, Cuff Size: Normal)   Pulse 80   Resp 14   Ht 5' 3.5" (1.613 m)   Wt 138 lb 9.6 oz (62.9 kg)   LMP 01/10/2006   BMI 24.17 kg/m   Weight change: @WEIGHTCHANGE @ Height:   Height: 5' 3.5" (161.3 cm)  Ht Readings from Last 3 Encounters:  04/06/17 5' 3.5" (1.613 m)  03/20/17 5' 3.5" (1.613 m)  12/14/15 5' 3.5" (1.613 m)    General appearance: alert, cooperative and appears stated age Head: Normocephalic, without obvious abnormality, atraumatic Neck: no adenopathy, supple, symmetrical, trachea midline and thyroid normal to inspection and palpation Lungs: clear to auscultation bilaterally Cardiovascular: regular rate and rhythm Breasts: normal appearance, no masses or tenderness Abdomen: soft, non-tender; non distended,  no masses,  no organomegaly Extremities: extremities normal, atraumatic, no cyanosis or edema Skin: Skin color, texture, turgor normal. No rashes or lesions Lymph nodes: Cervical, supraclavicular, and axillary nodes normal. No abnormal inguinal nodes palpated Neurologic: Grossly normal   Pelvic: External genitalia:  no lesions              Urethra:  normal appearing urethra with no masses, tenderness or lesions              Bartholins and Skenes: normal                 Vagina: normal appearing atrophic vagina with normal color and discharge, no lesions              Cervix: no lesions               Bimanual Exam:  Uterus:  normal size, contour, position, consistency, mobility, non-tender              Adnexa: no mass, fullness, tenderness               Rectovaginal: Confirms               Anus:  normal sphincter tone, no lesions  Chaperone was present for exam.  A:  Well Woman with normal exam  Osteoporosis  Vit d def (recent testing)  P:   No pap this year  Calcium, vit D,  exercise  Discussed treatment options, information from Up to date given  Labs are UTD  Mammogram next month  Colonoscopy UTD  Start taking 1,000 IU of vit d 3 daily

## 2017-04-06 NOTE — Patient Instructions (Addendum)
Take 1,000 IU of vit D 3 daily long term  1,200 mg a day of calcium between diet and supplement.   Osteoporosis Osteoporosis is the thinning and loss of density in the bones. Osteoporosis makes the bones more brittle, fragile, and likely to break (fracture). Over time, osteoporosis can cause the bones to become so weak that they fracture after a simple fall. The bones most likely to fracture are the bones in the hip, wrist, and spine. What are the causes? The exact cause is not known. What increases the risk? Anyone can develop osteoporosis. You may be at greater risk if you have a family history of the condition or have poor nutrition. You may also have a higher risk if you are:  Female.  63 years old or older.  A smoker.  Not physically active.  White or Asian.  Slender.  What are the signs or symptoms? A fracture might be the first sign of the disease, especially if it results from a fall or injury that would not usually cause a bone to break. Other signs and symptoms include:  Low back and neck pain.  Stooped posture.  Height loss.  How is this diagnosed? To make a diagnosis, your health care provider may:  Take a medical history.  Perform a physical exam.  Order tests, such as: ? A bone mineral density test. ? A dual-energy X-ray absorptiometry test.  How is this treated? The goal of osteoporosis treatment is to strengthen your bones to reduce your risk of a fracture. Treatment may involve:  Making lifestyle changes, such as: ? Eating a diet rich in calcium. ? Doing weight-bearing and muscle-strengthening exercises. ? Stopping tobacco use. ? Limiting alcohol intake.  Taking medicine to slow the process of bone loss or to increase bone density.  Monitoring your levels of calcium and vitamin D.  Follow these instructions at home:  Include calcium and vitamin D in your diet. Calcium is important for bone health, and vitamin D helps the body absorb  calcium.  Perform weight-bearing and muscle-strengthening exercises as directed by your health care provider.  Do not use any tobacco products, including cigarettes, chewing tobacco, and electronic cigarettes. If you need help quitting, ask your health care provider.  Limit your alcohol intake.  Take medicines only as directed by your health care provider.  Keep all follow-up visits as directed by your health care provider. This is important.  Take precautions at home to lower your risk of falling, such as: ? Keeping rooms well lit and clutter free. ? Installing safety rails on stairs. ? Using rubber mats in the bathroom and other areas that are often wet or slippery. Get help right away if: You fall or injure yourself. This information is not intended to replace advice given to you by your health care provider. Make sure you discuss any questions you have with your health care provider. Document Released: 10/06/2004 Document Revised: 06/01/2015 Document Reviewed: 06/06/2013 Elsevier Interactive Patient Education  2018 [place] AND DIET:  We recommended that you start or continue a regular exercise program for good health. Regular exercise means any activity that makes your heart beat faster and makes you sweat.  We recommend exercising at least 30 minutes per day at least 3 days a week, preferably 4 or 5.  We also recommend a diet low in fat and sugar.  Inactivity, poor dietary choices and obesity can cause diabetes, heart attack, stroke, and kidney damage, among others.  ALCOHOL AND SMOKING:  Women should limit their alcohol intake to no more than 7 drinks/beers/glasses of wine (combined, not each!) per week. Moderation of alcohol intake to this level decreases your risk of breast cancer and liver damage. And of course, no recreational drugs are part of a healthy lifestyle.  And absolutely no smoking or even second hand smoke. Most people know smoking can cause heart and  lung diseases, but did you know it also contributes to weakening of your bones? Aging of your skin?  Yellowing of your teeth and nails?  CALCIUM AND VITAMIN D:  Adequate intake of calcium and Vitamin D are recommended.  The recommendations for exact amounts of these supplements seem to change often, but generally speaking 600 mg of calcium (either carbonate or citrate) and 800 units of Vitamin D per day seems prudent. Certain women may benefit from higher intake of Vitamin D.  If you are among these women, your doctor will have told you during your visit.    PAP SMEARS:  Pap smears, to check for cervical cancer or precancers,  have traditionally been done yearly, although recent scientific advances have shown that most women can have pap smears less often.  However, every woman still should have a physical exam from her gynecologist every year. It will include a breast check, inspection of the vulva and vagina to check for abnormal growths or skin changes, a visual exam of the cervix, and then an exam to evaluate the size and shape of the uterus and ovaries.  And after 63 years of age, a rectal exam is indicated to check for rectal cancers. We will also provide age appropriate advice regarding health maintenance, like when you should have certain vaccines, screening for sexually transmitted diseases, bone density testing, colonoscopy, mammograms, etc.   MAMMOGRAMS:  All women over 63 years old should have a yearly mammogram. Many facilities now offer a "3D" mammogram, which may cost around $50 extra out of pocket. If possible,  we recommend you accept the option to have the 3D mammogram performed.  It both reduces the number of women who will be called back for extra views which then turn out to be normal, and it is better than the routine mammogram at detecting truly abnormal areas.    COLONOSCOPY:  Colonoscopy to screen for colon cancer is recommended for all women at age 63.  We know, you hate the idea of  the prep.  We agree, BUT, having colon cancer and not knowing it is worse!!  Colon cancer so often starts as a polyp that can be seen and removed at colonscopy, which can quite literally save your life!  And if your first colonoscopy is normal and you have no family history of colon cancer, most women don't have to have it again for 10 years.  Once every ten years, you can do something that may end up saving your life, right?  We will be happy to help you get it scheduled when you are ready.  Be sure to check your insurance coverage so you understand how much it will cost.  It may be covered as a preventative service at no cost, but you should check your particular policy.

## 2017-04-12 ENCOUNTER — Other Ambulatory Visit: Payer: Self-pay | Admitting: Internal Medicine

## 2017-04-12 MED FILL — PANTOPRAZOLE SOD DR 40 MG T: 40 | 90 days supply | Qty: 90 | Fill #0

## 2017-04-12 MED FILL — CHLORTHALIDONE 50 MG TAB: 50 | 30 days supply | Qty: 30 | Fill #2

## 2017-04-26 DIAGNOSIS — H903 Sensorineural hearing loss, bilateral: Secondary | ICD-10-CM | POA: Diagnosis not present

## 2017-04-26 DIAGNOSIS — H9312 Tinnitus, left ear: Secondary | ICD-10-CM | POA: Diagnosis not present

## 2017-04-26 DIAGNOSIS — H8102 Meniere's disease, left ear: Secondary | ICD-10-CM | POA: Diagnosis not present

## 2017-05-09 ENCOUNTER — Telehealth: Payer: Self-pay

## 2017-05-09 NOTE — Telephone Encounter (Signed)
Left message asking patient to call back to schedule nurse visit to get first shingrix vaccine---let tamara,RN at [facility] know when appt is made so that both vaccines can be labeled 

## 2017-05-24 NOTE — Telephone Encounter (Signed)
Scheduled pt for 5/16 @ 9:15

## 2017-05-24 NOTE — Telephone Encounter (Signed)
Both vaccines labeled and placed in refrigerator

## 2017-05-25 ENCOUNTER — Ambulatory Visit (INDEPENDENT_AMBULATORY_CARE_PROVIDER_SITE_OTHER): Payer: 59 | Admitting: *Deleted

## 2017-05-25 DIAGNOSIS — Z23 Encounter for immunization: Secondary | ICD-10-CM | POA: Diagnosis not present

## 2017-07-25 ENCOUNTER — Ambulatory Visit (INDEPENDENT_AMBULATORY_CARE_PROVIDER_SITE_OTHER): Payer: 59

## 2017-07-25 DIAGNOSIS — Z23 Encounter for immunization: Secondary | ICD-10-CM | POA: Diagnosis not present

## 2017-08-23 ENCOUNTER — Telehealth: Payer: Self-pay

## 2017-08-23 ENCOUNTER — Ambulatory Visit (INDEPENDENT_AMBULATORY_CARE_PROVIDER_SITE_OTHER): Payer: 59 | Admitting: Internal Medicine

## 2017-08-23 ENCOUNTER — Encounter: Payer: Self-pay | Admitting: Internal Medicine

## 2017-08-23 DIAGNOSIS — H60331 Swimmer's ear, right ear: Secondary | ICD-10-CM | POA: Diagnosis not present

## 2017-08-23 DIAGNOSIS — H6091 Unspecified otitis externa, right ear: Secondary | ICD-10-CM | POA: Insufficient documentation

## 2017-08-23 MED ORDER — NEOMYCIN-POLYMYXIN-HC 3.5-10000-1 OT SUSP: 3.0000 [drp] | Freq: Three times a day (TID) | OTIC | 0 refills | Status: DC

## 2017-08-23 MED ORDER — NEOMYCIN-POLYMYXIN-DEXAMETH 3.5-10000-0.1 OP SUSP: 3.0000 [drp] | Freq: Three times a day (TID) | OPHTHALMIC | 0 refills | Status: DC

## 2017-08-23 MED FILL — NEO/POLYMYXIN/DEXAMETH DROP: 3.5-10000-0 | 11 days supply | Qty: 5 | Fill #0

## 2017-08-23 NOTE — Telephone Encounter (Signed)
Copied from [facility] [id number]. Topic: General - Other >> Aug 23, 2017  9:01 AM Keene Breath wrote: Reason for CRM: Danton Clap from [facility] called to inform doctor that the patient's prescription for neomycin-polymyxin-hydrocortisone (CORTISPORIN) 3.5-10000-1 OTIC suspension has a co-pay of $78.00.  Would like to know if they can switch to Maxitrol eye drops, which are more affordable.  Please advise.  CB# [phone].

## 2017-08-23 NOTE — Addendum Note (Signed)
Addended by: Pricilla Holm A on: 08/23/2017 11:03 AM   Modules accepted: Orders

## 2017-08-23 NOTE — Telephone Encounter (Signed)
LVM informing patient of the change and Md response about meds. Tried calling pharmacy but the phone number was not working

## 2017-08-23 NOTE — Patient Instructions (Signed)
We have sent in the ear drops to use 3 drops 3 times a day for 3 days.   

## 2017-08-23 NOTE — Telephone Encounter (Signed)
Sent in eye drops. Please be sure patient is understanding to use these in right ear 3 drops 3 times a day.
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
## 2017-08-23 NOTE — Assessment & Plan Note (Signed)
Cortisporin ear drops to treat.

## 2017-09-22 ENCOUNTER — Other Ambulatory Visit: Payer: Self-pay | Admitting: Obstetrics and Gynecology

## 2017-09-22 DIAGNOSIS — Z1231 Encounter for screening mammogram for malignant neoplasm of breast: Secondary | ICD-10-CM

## 2017-10-19 ENCOUNTER — Ambulatory Visit
Admission: RE | Admit: 2017-10-19 | Discharge: 2017-10-19 | Disposition: A | Discharge status: Home / Self Care | Payer: 59 | Source: Ambulatory Visit | Attending: Obstetrics and Gynecology | Admitting: Obstetrics and Gynecology

## 2017-10-19 DIAGNOSIS — Z1231 Encounter for screening mammogram for malignant neoplasm of breast: Secondary | ICD-10-CM

## 2017-11-27 DIAGNOSIS — H5203 Hypermetropia, bilateral: Secondary | ICD-10-CM | POA: Diagnosis not present

## 2018-03-06 MED FILL — DEXAMETHASONE 4 MG TABLET: 4 | 3 days supply | Qty: 9 | Fill #0

## 2018-03-06 MED FILL — IBUPROFEN 400 MG TABS: 400 | 5 days supply | Qty: 30 | Fill #0

## 2018-03-06 MED FILL — AMOXICILLIN 500 MG CAPSULE: 500 | 3 days supply | Qty: 9 | Fill #0

## 2018-03-06 MED FILL — HYDROCODON-APAP 5-325: 5-325 | 2 days supply | Qty: 5 | Fill #0

## 2018-03-06 MED FILL — CHLORHEXIDINE 0.12% RINSE: 0.12 | 15 days supply | Qty: 473 | Fill #0

## 2018-03-21 ENCOUNTER — Telehealth: Payer: 59 | Admitting: Nurse Practitioner

## 2018-03-21 ENCOUNTER — Encounter: Payer: Self-pay | Admitting: Internal Medicine

## 2018-03-21 DIAGNOSIS — J111 Influenza due to unidentified influenza virus with other respiratory manifestations: Secondary | ICD-10-CM

## 2018-03-21 MED ORDER — OSELTAMIVIR PHOSPHATE 75 MG PO CAPS: 75.0000 mg | ORAL_CAPSULE | Freq: Two times a day (BID) | ORAL | 0 refills | Status: DC

## 2018-03-21 MED FILL — OSELTAMIVIR PHOSPHATE 75 MG: 75 | 5 days supply | Qty: 10 | Fill #0

## 2018-03-21 NOTE — Progress Notes (Signed)

## 2018-04-01 ENCOUNTER — Encounter (HOSPITAL_COMMUNITY): Payer: Self-pay

## 2018-04-01 ENCOUNTER — Ambulatory Visit (HOSPITAL_COMMUNITY)
Admission: EM | Admit: 2018-04-01 | Discharge: 2018-04-01 | Disposition: A | Discharge status: Home / Self Care | Payer: PRIVATE HEALTH INSURANCE | Attending: Emergency Medicine | Admitting: Emergency Medicine

## 2018-04-01 ENCOUNTER — Other Ambulatory Visit: Payer: Self-pay

## 2018-04-01 DIAGNOSIS — L03011 Cellulitis of right finger: Secondary | ICD-10-CM | POA: Diagnosis not present

## 2018-04-01 MED ORDER — LIDOCAINE HCL (PF) 1 % IJ SOLN
INTRAMUSCULAR | Status: AC
  Filled 2018-04-01: qty 2

## 2018-04-01 MED ORDER — CEFTRIAXONE SODIUM 1 G IJ SOLR
1.0000 g | Freq: Once | INTRAMUSCULAR | Status: AC
  Administered 2018-04-01: 1 g via INTRAMUSCULAR

## 2018-04-01 MED ORDER — CEPHALEXIN 500 MG PO CAPS: 500.0000 mg | ORAL_CAPSULE | Freq: Four times a day (QID) | ORAL | 0 refills | Status: AC

## 2018-04-01 MED ORDER — CEFTRIAXONE SODIUM 1 G IJ SOLR
INTRAMUSCULAR | Status: AC
  Filled 2018-04-01: qty 10

## 2018-04-01 MED ORDER — MUPIROCIN 2 % EX OINT: 1.0000 "application " | TOPICAL_OINTMENT | Freq: Two times a day (BID) | CUTANEOUS | 0 refills | Status: DC

## 2018-04-01 NOTE — Discharge Instructions (Signed)
We gave you an injection of rocephin today Please begin keflex 4 times daily for 1 week Soak finger in warm water 2-3 times a day, gently massage to express further drainage, dry well and apply bactroban after soaks twice daily  If you have any worsening of symptoms in the next 24-48 hours, develop fever, feel lightheaded, dizzy, please go directly to emergency room

## 2018-04-01 NOTE — ED Provider Notes (Signed)
[facility]    CSN: [id number] Arrival date & time: 04/01/18  1159     History   Chief Complaint Chief Complaint  Patient presents with  . Finger Infection    HPI [name] is a 63 y.o. female history of GERD, osteoporosis, presenting today for evaluation of finger infection.  Patient states that she slammed her right index finger into a door at work approximately 1 week ago.  Since she has developed increased redness and pain around this area.  In the past 24 hours she has had increasing swelling, area of pus as well as streaking up her arm.  She denies any fevers, she checked her temperature multiple times.  She did take Tylenol around 8 AM this morning.  She has had thing and aching discomfort in her right axilla.  Denies fatigue, weakness, dizziness or lightheadedness.  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  HPI  Past Medical History:  Diagnosis Date  . GERD (gastroesophageal reflux disease)    takes Omeprazole and Protonix daily  . H/O hiatal hernia   . History of bronchitis    15+yrs ago  . History of colon polyps   . History of vertigo    takes Antivert daily as needed  . Joint pain   . Meniere's disease    L  hearing loss related to same  . Osteoporosis   . PMB (postmenopausal bleeding) 12/07   proliferative endo. No HRT, PUS normal    Patient Active Problem List   Diagnosis Date Noted  . Otitis externa of right ear 08/23/2017  . Vitamin D deficiency 04/06/2017  . Routine general medical examination at a health care facility 03/28/2014  . Osteoporosis, unspecified 08/24/2012  . Tinnitus 03/12/2010  . GERD 03/11/2010    Past Surgical History:  Procedure Laterality Date  . ABSCESS DRAINAGE  09/2010   suprapubic  . BTSP  1994  . CATARACT EXTRACTION EXTRACAPSULAR Right 10/2014  . COLONOSCOPY W/ BIOPSIES  12/12/05   hyperplastic polyp, recheck in 10 years  . CYSTECTOMY     from buttock  . GAS/FLUID EXCHANGE Right 12/23/2013    Procedure: GAS/FLUID EXCHANGE;  Surgeon: Corliss Parish, MD;  Location: [facility];  Service: Ophthalmology;  Laterality: Right;  SF6  . [place]  . MEMBRANE PEEL Right 12/23/2013   Procedure: MEMBRANE PEEL;  Surgeon: Corliss Parish, MD;  Location: [facility];  Service: Ophthalmology;  Laterality: Right;  . PARS PLANA VITRECTOMY Right 10/07/2013   Procedure: PARS PLANA VITRECTOMY WITH 25 GAUGE RIGHT EYE;  Surgeon: Corliss Parish, MD;  Location: [facility];  Service: Ophthalmology;  Laterality: Right;  . PARS PLANA VITRECTOMY Right 12/23/2013   Procedure: PARS PLANA VITRECTOMY WITH 25 GAUGE;  Surgeon: Corliss Parish, MD;  Location: [facility];  Service: Ophthalmology;  Laterality: Right;  . TUBAL LIGATION      OB History    Gravida  0   Para  0   Term  0   Preterm  0   AB  0   Living  0     SAB  0   TAB  0   Ectopic  0   Multiple  0   Live Births  0            Home Medications    Prior to Admission medications   Medication Sig Start Date End Date Taking? Authorizing Provider  cephALEXin (KEFLEX) 500  MG capsule Take 1 capsule (500 mg total) by mouth 4 (four) times daily for 7 days. 04/01/18 04/08/18  Irbin Fines C, PA-C  chlorthalidone (HYGROTON) 50 MG tablet  01/30/17   [provider]  fluticasone (FLONASE) 50 MCG/ACT nasal spray Place 2 sprays into both nostrils daily. 03/27/17   Hoyt Koch, MD  metroNIDAZOLE (METROGEL) 1 % gel Apply topically daily. 10/19/15   Kem Boroughs, FNP  Multiple Vitamins-Minerals (MULTIVITAMIN WITH MINERALS) tablet Take 1 tablet by mouth daily.      [provider]  mupirocin ointment (BACTROBAN) 2 % Apply 1 application topically 2 (two) times daily. 04/01/18   Lennan Malone C, PA-C  neomycin-polymyxin b-dexamethasone (MAXITROL) 3.5-10000-0.1 SUSP Place 3 drops into the right eye 3 (three) times daily. 08/23/17   Hoyt Koch, MD  oseltamivir (TAMIFLU) 75 MG capsule  Take 1 capsule (75 mg total) by mouth 2 (two) times daily. 03/21/18   Hassell Done Mary-Margaret, FNP  pantoprazole (PROTONIX) 40 MG tablet TAKE 1 TABLET BY MOUTH ONCE DAILY AS NEEDED FOR ACID REFLUX 04/12/17   Hoyt Koch, MD    Family History Family History  Problem Relation Age of Onset  . Hypertension Mother   . Atrial fibrillation Mother   . Deep vein thrombosis Mother   . Osteoporosis Mother        compression fx  . Thyroid disease Mother   . Osteoarthritis Mother   . Arthritis Mother        shoulder replacement  . Stroke Father   . Anorexia nervosa Sister   . Osteoporosis Sister   . Thyroid disease Sister   . Diabetes Brother   . Cancer Paternal Grandmother        multi-organ    Social History Social History   Tobacco Use  . Smoking status: Never Smoker  . Smokeless tobacco: Never Used  Substance Use Topics  . Alcohol use: Yes    Comment: socially  . Drug use: No     Allergies   Latex and Sulfonamide derivatives   Review of Systems Review of Systems  Constitutional: Negative for fatigue and fever.  Eyes: Negative for visual disturbance.  Respiratory: Negative for shortness of breath.   Cardiovascular: Negative for chest pain.  Gastrointestinal: Negative for abdominal pain, nausea and vomiting.  Musculoskeletal: Positive for joint swelling. Negative for arthralgias.  Skin: Positive for color change and wound. Negative for rash.  Neurological: Negative for dizziness, weakness, light-headedness and headaches.     Physical Exam Triage Vital Signs ED Triage Vitals  Enc Vitals Group     BP 04/01/18 1212 (!) 152/94     Pulse Rate 04/01/18 1212 88     Resp 04/01/18 1212 16     Temp 04/01/18 1212 97.7 F (36.5 C)     Temp Source 04/01/18 1212 Oral     SpO2 04/01/18 1212 98 %     Weight --      Height --      Head Circumference --      Peak Flow --      Pain Score 04/01/18 1225 7     Pain Loc --      Pain Edu? --      Excl. in [place]? --    No  data found.  Updated Vital Signs BP (!) 152/94 (BP Location: Left Arm)   Pulse 88   Temp 97.7 F (36.5 C) (Oral)   Resp 16   LMP 01/10/2006   SpO2 98%  Visual Acuity Right Eye Distance:   Left Eye Distance:   Bilateral Distance:    Right Eye Near:   Left Eye Near:    Bilateral Near:     Physical Exam Vitals signs and nursing note reviewed.  Constitutional:      Appearance: She is well-developed.     Comments: No acute distress  HENT:     Head: Normocephalic and atraumatic.     Nose: Nose normal.  Eyes:     Conjunctiva/sclera: Conjunctivae normal.  Neck:     Musculoskeletal: Neck supple.  Cardiovascular:     Rate and Rhythm: Normal rate.  Pulmonary:     Effort: Pulmonary effort is normal. No respiratory distress.  Abdominal:     General: There is no distension.  Musculoskeletal: Normal range of motion.  Skin:    General: Skin is warm and dry.     Comments: Right index finger with erythema and swelling around nail fold, large pocket of pus approximately 1 cm x 1 cm to distal phalanx, full active range of motion at DIP and PIP of right index finger.  Nontender over distal pulp.  Redness streaking up the forearm Radial pulse 2+  No palpable lymphadenopathy in axilla  Neurological:     Mental Status: She is alert and oriented to person, place, and time.      UC Treatments / Results  Labs (all labs ordered are listed, but only abnormal results are displayed) Labs Reviewed - No data to display  EKG None  Radiology No results found.  Procedures Incision and Drainage Date/Time: 04/01/2018 1:15 PM Performed by: Veida Spira, [place] C, PA-C Authorized by: Kaidyn Hernandes, Elesa Hacker, PA-C   Consent:    Consent obtained:  Verbal   Consent given by:  Patient   Risks discussed:  Pain and incomplete drainage   Alternatives discussed:  No treatment Location:    Type:  Abscess   Size:  1   Location:  Upper extremity   Upper extremity location:  Finger   Finger location:   R index finger Pre-procedure details:    Skin preparation:  Betadine Anesthesia (see MAR for exact dosages):    Anesthesia method:  None Procedure type:    Complexity:  Simple Procedure details:    Incision types:  Single straight   Incision depth:  Dermal   Drainage:  Purulent and bloody   Drainage amount:  Copious   Wound treatment:  Wound left open   Packing materials:  None Post-procedure details:    Patient tolerance of procedure:  Tolerated well, no immediate complications   (including critical care time)  Medications Ordered in UC Medications  cefTRIAXone (ROCEPHIN) injection 1 g (1 g Intramuscular Given 04/01/18 1310)    Initial Impression / Assessment and Plan / UC Course  I have reviewed the triage vital signs and the nursing notes.  Pertinent labs & imaging results that were available during my care of the patient were reviewed by me and considered in my medical decision making (see chart for details).     Vital signs stable in clinic without tachycardia or fever, no reported fevers at home.  Patient with paronychia with streaking more proximally.  Clinical picture does not appear concern for sepsis/sirs at this time, will provide outpatient trial with close follow-up.  Area drained relieving pressure and pus.  Improvement in discomfort.  Injection of Rocephin provided prior to discharge, begin Keflex.  Warm soaks and Bactroban.  Given streaking advised if she has any worsening of  her symptoms in the next 24 to 48 hours or not responding to antibiotics for her to report to the emergency room for further treatment. Final Clinical Impressions(s) / UC Diagnoses   Final diagnoses:  Paronychia of right index finger     Discharge Instructions     We gave you an injection of rocephin today Please begin keflex 4 times daily for 1 week Soak finger in warm water 2-3 times a day, gently massage to express further drainage, dry well and apply bactroban after soaks twice daily   If you have any worsening of symptoms in the next 24-48 hours, develop fever, feel lightheaded, dizzy, please go directly to emergency room   ED Prescriptions    Medication Sig Dispense Auth. Provider   cephALEXin (KEFLEX) 500 MG capsule Take 1 capsule (500 mg total) by mouth 4 (four) times daily for 7 days. 28 capsule Nichollas Perusse C, PA-C   mupirocin ointment (BACTROBAN) 2 % Apply 1 application topically 2 (two) times daily. 22 g Detrice Cales, Mathews C, PA-C     Controlled Substance Prescriptions [place] Controlled Substance Registry consulted? Not Applicable   Janith Lima, [place] 04/01/18 1318

## 2018-04-01 NOTE — ED Triage Notes (Signed)
Pt presents with an infection in right index finger from slamming it in the drawer a week ago.

## 2018-04-12 ENCOUNTER — Encounter: Payer: 59 | Admitting: Internal Medicine

## 2018-04-13 ENCOUNTER — Other Ambulatory Visit: Payer: Self-pay

## 2018-04-13 ENCOUNTER — Ambulatory Visit: Payer: Self-pay

## 2018-04-13 ENCOUNTER — Other Ambulatory Visit: Payer: Self-pay | Admitting: Family Medicine

## 2018-04-13 DIAGNOSIS — M79644 Pain in right finger(s): Secondary | ICD-10-CM

## 2018-05-03 ENCOUNTER — Ambulatory Visit: Payer: 59 | Admitting: Obstetrics and Gynecology

## 2018-06-08 DIAGNOSIS — M13842 Other specified arthritis, left hand: Secondary | ICD-10-CM | POA: Diagnosis not present

## 2018-06-08 DIAGNOSIS — M79642 Pain in left hand: Secondary | ICD-10-CM | POA: Diagnosis not present

## 2018-06-08 DIAGNOSIS — M65332 Trigger finger, left middle finger: Secondary | ICD-10-CM | POA: Insufficient documentation

## 2018-06-08 DIAGNOSIS — M19042 Primary osteoarthritis, left hand: Secondary | ICD-10-CM | POA: Insufficient documentation

## 2018-09-25 ENCOUNTER — Other Ambulatory Visit: Payer: Self-pay | Admitting: Obstetrics and Gynecology

## 2018-09-25 DIAGNOSIS — Z1231 Encounter for screening mammogram for malignant neoplasm of breast: Secondary | ICD-10-CM

## 2018-11-06 NOTE — Progress Notes (Signed)
63 y.o. G0P0000 Single White or Caucasian Not Hispanic or Latino female here for annual exam. Lives with her partner, not sexually active.  She notices swelling in her left axilla, wonders if she has a fatty tumor. No vaginal bleeding.   H/O osteoporosis, no treatment.      Patient's last menstrual period was 01/10/2006.          Sexually active: No.  The current method of family planning is post menopausal status.    Exercising: No.  The patient does not participate in regular exercise at present. Smoker:  no  Health Maintenance: Pap: 10-19-15 Neg:Neg HR HPV, 06-11-11 Neg History of abnormal Pap:  no MMG:  10/19/2017 Birads 1 negative, today Colonoscopy:05-10-16 polyps;next 5 years BMD:   04-27-16 Osteoporosis. T score of -2.7 in the left hip. No statistically significant change since her prior BMD in 5/11 TDaP:  03-20-17 Gardasil: N/A   reports that she has never smoked. She has never used smokeless tobacco. She reports current alcohol use. She reports that she does not use drugs. Drinks 2 bottles of wine a week. 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  Past Medical History:  Diagnosis Date  . GERD (gastroesophageal reflux disease)    takes Omeprazole and Protonix daily  . H/O hiatal hernia   . History of bronchitis    15+yrs ago  . History of colon polyps   . History of vertigo    takes Antivert daily as needed  . Joint pain   . Meniere's disease    L  hearing loss related to same  . Osteoporosis   . PMB (postmenopausal bleeding) 12/07   proliferative endo. No HRT, PUS normal    Past Surgical History:  Procedure Laterality Date  . ABSCESS DRAINAGE  09/2010   suprapubic  . BTSP  1994  . CATARACT EXTRACTION EXTRACAPSULAR Right 10/2014  . COLONOSCOPY W/ BIOPSIES  12/12/05   hyperplastic polyp, recheck in 10 years  . CYSTECTOMY     from buttock  . GAS/FLUID EXCHANGE Right 12/23/2013   Procedure: GAS/FLUID EXCHANGE;  Surgeon: Corliss Parish, MD;  Location: [facility];   Service: Ophthalmology;  Laterality: Right;  SF6  . [place]  . MEMBRANE PEEL Right 12/23/2013   Procedure: MEMBRANE PEEL;  Surgeon: Corliss Parish, MD;  Location: [facility];  Service: Ophthalmology;  Laterality: Right;  . PARS PLANA VITRECTOMY Right 10/07/2013   Procedure: PARS PLANA VITRECTOMY WITH 25 GAUGE RIGHT EYE;  Surgeon: Corliss Parish, MD;  Location: [facility];  Service: Ophthalmology;  Laterality: Right;  . PARS PLANA VITRECTOMY Right 12/23/2013   Procedure: PARS PLANA VITRECTOMY WITH 25 GAUGE;  Surgeon: Corliss Parish, MD;  Location: [facility];  Service: Ophthalmology;  Laterality: Right;  . TUBAL LIGATION      Current Outpatient Medications  Medication Sig Dispense Refill  . fluticasone (FLONASE) 50 MCG/ACT nasal spray Place 2 sprays into both nostrils daily. 16 g 6  . Multiple Vitamins-Minerals (MULTIVITAMIN WITH MINERALS) tablet Take 1 tablet by mouth daily.      . pantoprazole (PROTONIX) 40 MG tablet TAKE 1 TABLET BY MOUTH ONCE DAILY AS NEEDED FOR ACID REFLUX 90 tablet 3   No current facility-administered medications for this visit.     Family History  Problem Relation Age of Onset  . Hypertension Mother   . Atrial fibrillation Mother   . Deep vein thrombosis Mother   . Osteoporosis Mother  compression fx  . Thyroid disease Mother   . Osteoarthritis Mother   . Arthritis Mother        shoulder replacement  . Stroke Father   . Anorexia nervosa Sister   . Osteoporosis Sister   . Thyroid disease Sister   . Diabetes Brother   . Cancer Paternal Grandmother        multi-organ    Review of Systems  Constitutional:       Changes to left axillary area  HENT: Negative.   Eyes: Negative.   Respiratory: Negative.   Cardiovascular: Negative.   Gastrointestinal: Negative.   Endocrine: Negative.   Genitourinary: Negative.   Musculoskeletal: Negative.   Skin: Negative.   Allergic/Immunologic: Negative.   Neurological:  Negative.   Hematological: Negative.   Psychiatric/Behavioral: Negative.     Exam:   BP 124/76 (BP Location: Right Arm, Patient Position: Sitting, Cuff Size: Normal)   Pulse 64   Temp (!) 97 F (36.1 C) (Skin)   Ht 5\' 4"  (1.626 m)   Wt 134 lb 6.4 oz (61 kg)   LMP 01/10/2006   BMI 23.07 kg/m   Weight change: @WEIGHTCHANGE @ Height:   Height: 5\' 4"  (162.6 cm)  Ht Readings from Last 3 Encounters:  11/08/18 5\' 4"  (1.626 m)  08/23/17 5' 3.5" (1.613 m)  04/06/17 5' 3.5" (1.613 m)    General appearance: alert, cooperative and appears stated age Head: Normocephalic, without obvious abnormality, atraumatic Neck: no adenopathy, supple, symmetrical, trachea midline and thyroid normal to inspection and palpation Lungs: clear to auscultation bilaterally Cardiovascular: regular rate and rhythm Breasts: normal appearance, no masses or tenderness Abdomen: soft, non-tender; non distended,  no masses,  no organomegaly Extremities: extremities normal, atraumatic, no cyanosis or edema Skin: Skin color, texture, turgor normal. No rashes or lesions Lymph nodes: Cervical, supraclavicular, and axillary nodes normal. No abnormal inguinal nodes palpated Neurologic: Grossly normal   Pelvic: External genitalia:  no lesions              Urethra:  normal appearing urethra with no masses, tenderness or lesions              Bartholins and Skenes: normal                 Vagina: atrophic appearing vagina with normal color and discharge, no lesions              Cervix: no lesions               Bimanual Exam:  Uterus:  normal size, contour, position, consistency, mobility, non-tender              Adnexa: no mass, fullness, tenderness               Rectovaginal: Confirms               Anus:  normal sphincter tone, no lesions  Chaperone was present for exam.  A:  Well Woman with normal exam  Osteoporosis, has declined medication  P:   No pap this year  Mammogram today  Colonoscopy UTD  Discussed  breast self exam  Discussed calcium and vit D intake  Labs with primary  DEXA ordered

## 2018-11-08 ENCOUNTER — Ambulatory Visit (INDEPENDENT_AMBULATORY_CARE_PROVIDER_SITE_OTHER): Payer: 59 | Admitting: Obstetrics and Gynecology

## 2018-11-08 ENCOUNTER — Other Ambulatory Visit: Payer: Self-pay

## 2018-11-08 ENCOUNTER — Ambulatory Visit (INDEPENDENT_AMBULATORY_CARE_PROVIDER_SITE_OTHER): Payer: 59 | Admitting: Internal Medicine

## 2018-11-08 ENCOUNTER — Encounter: Payer: Self-pay | Admitting: Obstetrics and Gynecology

## 2018-11-08 ENCOUNTER — Other Ambulatory Visit (INDEPENDENT_AMBULATORY_CARE_PROVIDER_SITE_OTHER): Payer: 59

## 2018-11-08 ENCOUNTER — Ambulatory Visit
Admission: RE | Admit: 2018-11-08 | Discharge: 2018-11-08 | Disposition: A | Discharge status: Home / Self Care | Payer: 59 | Source: Ambulatory Visit | Attending: Obstetrics and Gynecology | Admitting: Obstetrics and Gynecology

## 2018-11-08 ENCOUNTER — Encounter: Payer: Self-pay | Admitting: Internal Medicine

## 2018-11-08 VITALS — BP 124/76 | HR 64 | Temp 97.0°F | Ht 64.0 in | Wt 134.4 lb

## 2018-11-08 VITALS — BP 120/70 | HR 76 | Temp 98.6°F | Ht 64.0 in | Wt 134.0 lb

## 2018-11-08 DIAGNOSIS — M810 Age-related osteoporosis without current pathological fracture: Secondary | ICD-10-CM | POA: Diagnosis not present

## 2018-11-08 DIAGNOSIS — E559 Vitamin D deficiency, unspecified: Secondary | ICD-10-CM | POA: Diagnosis not present

## 2018-11-08 DIAGNOSIS — Z1231 Encounter for screening mammogram for malignant neoplasm of breast: Secondary | ICD-10-CM | POA: Diagnosis not present

## 2018-11-08 DIAGNOSIS — Z0000 Encounter for general adult medical examination without abnormal findings: Secondary | ICD-10-CM

## 2018-11-08 DIAGNOSIS — K219 Gastro-esophageal reflux disease without esophagitis: Secondary | ICD-10-CM | POA: Diagnosis not present

## 2018-11-08 DIAGNOSIS — Z01419 Encounter for gynecological examination (general) (routine) without abnormal findings: Secondary | ICD-10-CM | POA: Diagnosis not present

## 2018-11-08 LAB — COMPREHENSIVE METABOLIC PANEL
ALT: 15 U/L (ref 0–35)
AST: 17 U/L (ref 0–37)
Albumin: 4.4 g/dL (ref 3.5–5.2)
Alkaline Phosphatase: 46 U/L (ref 39–117)
BUN: 19 mg/dL (ref 6–23)
CO2: 30 mEq/L (ref 19–32)
Calcium: 9.2 mg/dL (ref 8.4–10.5)
Chloride: 103 mEq/L (ref 96–112)
Creatinine, Ser: 0.82 mg/dL (ref 0.40–1.20)
GFR: 70.16 mL/min (ref >60.00)
Glucose, Bld: 90 mg/dL (ref 70–99)
Potassium: 4 mEq/L (ref 3.5–5.1)
Sodium: 140 mEq/L (ref 135–145)
Total Bilirubin: 0.4 mg/dL (ref 0.2–1.2)
Total Protein: 6.9 g/dL (ref 6.0–8.3)

## 2018-11-08 LAB — LIPID PANEL
Cholesterol: 200 mg/dL (ref 0–200)
HDL: 72 mg/dL (ref >39.00)
LDL Cholesterol: 108 mg/dL — ABNORMAL HIGH (ref 0–99)
NonHDL: 128.38
Total CHOL/HDL Ratio: 3
Triglycerides: 101 mg/dL (ref 0.0–149.0)
VLDL: 20.2 mg/dL (ref 0.0–40.0)

## 2018-11-08 LAB — CBC
HCT: 40.5 % (ref 36.0–46.0)
Hemoglobin: 13.7 g/dL (ref 12.0–15.0)
MCHC: 33.8 g/dL (ref 30.0–36.0)
MCV: 93.3 fl (ref 78.0–100.0)
Platelets: 237 10*3/uL (ref 150.0–400.0)
RBC: 4.34 Mil/uL (ref 3.87–5.11)
RDW: 12.7 % (ref 11.5–15.5)
WBC: 7.7 10*3/uL (ref 4.0–10.5)

## 2018-11-08 MED ORDER — PANTOPRAZOLE SODIUM 40 MG PO TBEC
DELAYED_RELEASE_TABLET | ORAL | 3 refills | Status: DC
Start: 2018-11-08 — End: 2020-05-11

## 2018-11-08 MED ORDER — FLUTICASONE PROPIONATE 50 MCG/ACT NA SUSP: 2.0000 | Freq: Every day | NASAL | 6 refills | Status: DC

## 2018-11-08 MED FILL — PANTOPRAZOLE SOD DR 40 MG T: 40 | 90 days supply | Qty: 90 | Fill #0

## 2018-11-08 MED FILL — FLUTICASONE PROP 50 MCG SPR: 50 | 30 days supply | Qty: 16 | Fill #0

## 2018-11-08 NOTE — Progress Notes (Signed)
   Subjective:   Patient ID: [name], female    DOB: 1955/07/11, 63 y.o.   MRN: [id number]  HPI The patient is a 63 YO female coming in for physical.   PMH, [place], social history reviewed and updated  Review of Systems  Constitutional: Negative.   HENT: Negative.   Eyes: Negative.   Respiratory: Negative for cough, chest tightness and shortness of breath.   Cardiovascular: Negative for chest pain, palpitations and leg swelling.  Gastrointestinal: Negative for abdominal distention, abdominal pain, constipation, diarrhea, nausea and vomiting.  Musculoskeletal: Negative.   Skin: Negative.   Neurological: Negative.   Psychiatric/Behavioral: Negative.     Objective:  Physical Exam Constitutional:      Appearance: She is well-developed.  HENT:     Head: Normocephalic and atraumatic.  Neck:     Musculoskeletal: Normal range of motion.  Cardiovascular:     Rate and Rhythm: Normal rate and regular rhythm.  Pulmonary:     Effort: Pulmonary effort is normal. No respiratory distress.     Breath sounds: Normal breath sounds. No wheezing or rales.  Abdominal:     General: Bowel sounds are normal. There is no distension.     Palpations: Abdomen is soft.     Tenderness: There is no abdominal tenderness. There is no rebound.  Skin:    General: Skin is warm and dry.  Neurological:     Mental Status: She is alert and oriented to person, place, and time.     Coordination: Coordination normal.     Vitals:   11/08/18 1254  BP: 120/70  Pulse: 76  Temp: 98.6 F (37 C)  TempSrc: Oral  SpO2: 95%  Weight: 134 lb (60.8 kg)  Height: 5\' 4"  (1.626 m)    Assessment & Plan:

## 2018-11-08 NOTE — Patient Instructions (Signed)
Health Maintenance, Female Adopting a healthy lifestyle and getting preventive care are important in promoting health and wellness. Ask your health care provider about:  The right schedule for you to have regular tests and exams.  Things you can do on your own to prevent diseases and keep yourself healthy. What should I know about diet, weight, and exercise? Eat a healthy diet   Eat a diet that includes plenty of vegetables, fruits, low-fat dairy products, and lean protein.  Do not eat a lot of foods that are high in solid fats, added sugars, or sodium. Maintain a healthy weight Body mass index (BMI) is used to identify weight problems. It estimates body fat based on height and weight. Your health care provider can help determine your BMI and help you achieve or maintain a healthy weight. Get regular exercise Get regular exercise. This is one of the most important things you can do for your health. Most adults should:  Exercise for at least 150 minutes each week. The exercise should increase your heart rate and make you sweat (moderate-intensity exercise).  Do strengthening exercises at least twice a week. This is in addition to the moderate-intensity exercise.  Spend less time sitting. Even light physical activity can be beneficial. Watch cholesterol and blood lipids Have your blood tested for lipids and cholesterol at 64 years of age, then have this test every 5 years. Have your cholesterol levels checked more often if:  Your lipid or cholesterol levels are high.  You are older than 64 years of age.  You are at high risk for heart disease. What should I know about cancer screening? Depending on your health history and family history, you may need to have cancer screening at various ages. This may include screening for:  Breast cancer.  Cervical cancer.  Colorectal cancer.  Skin cancer.  Lung cancer. What should I know about heart disease, diabetes, and high blood  pressure? Blood pressure and heart disease  High blood pressure causes heart disease and increases the risk of stroke. This is more likely to develop in people who have high blood pressure readings, are of African descent, or are overweight.  Have your blood pressure checked: ? Every 3-5 years if you are 18-39 years of age. ? Every year if you are 40 years old or older. Diabetes Have regular diabetes screenings. This checks your fasting blood sugar level. Have the screening done:  Once every three years after age 40 if you are at a normal weight and have a low risk for diabetes.  More often and at a younger age if you are overweight or have a high risk for diabetes. What should I know about preventing infection? Hepatitis B If you have a higher risk for hepatitis B, you should be screened for this virus. Talk with your health care provider to find out if you are at risk for hepatitis B infection. Hepatitis C Testing is recommended for:  Everyone born from 1945 through 1965.  Anyone with known risk factors for hepatitis C. Sexually transmitted infections (STIs)  Get screened for STIs, including gonorrhea and chlamydia, if: ? You are sexually active and are younger than 64 years of age. ? You are older than 64 years of age and your health care provider tells you that you are at risk for this type of infection. ? Your sexual activity has changed since you were last screened, and you are at increased risk for chlamydia or gonorrhea. Ask your health care provider if   you are at risk.  Ask your health care provider about whether you are at high risk for HIV. Your health care provider may recommend a prescription medicine to help prevent HIV infection. If you choose to take medicine to prevent HIV, you should first get tested for HIV. You should then be tested every 3 months for as long as you are taking the medicine. Pregnancy  If you are about to stop having your period (premenopausal) and  you may become pregnant, seek counseling before you get pregnant.  Take 400 to 800 micrograms (mcg) of folic acid every day if you become pregnant.  Ask for birth control (contraception) if you want to prevent pregnancy. Osteoporosis and menopause Osteoporosis is a disease in which the bones lose minerals and strength with aging. This can result in bone fractures. If you are 65 years old or older, or if you are at risk for osteoporosis and fractures, ask your health care provider if you should:  Be screened for bone loss.  Take a calcium or vitamin D supplement to lower your risk of fractures.  Be given hormone replacement therapy (HRT) to treat symptoms of menopause. Follow these instructions at home: Lifestyle  Do not use any products that contain nicotine or tobacco, such as cigarettes, e-cigarettes, and chewing tobacco. If you need help quitting, ask your health care provider.  Do not use street drugs.  Do not share needles.  Ask your health care provider for help if you need support or information about quitting drugs. Alcohol use  Do not drink alcohol if: ? Your health care provider tells you not to drink. ? You are pregnant, may be pregnant, or are planning to become pregnant.  If you drink alcohol: ? Limit how much you use to 0-1 drink a day. ? Limit intake if you are breastfeeding.  Be aware of how much alcohol is in your drink. In the [place] one drink equals one 12 oz bottle of beer (355 mL), one 5 oz glass of wine (148 mL), or one 1 oz glass of hard liquor (44 mL). General instructions  Schedule regular health, dental, and eye exams.  Stay current with your vaccines.  Tell your health care provider if: ? You often feel depressed. ? You have ever been abused or do not feel safe at home. Summary  Adopting a healthy lifestyle and getting preventive care are important in promoting health and wellness.  Follow your health care provider's instructions about healthy  diet, exercising, and getting tested or screened for diseases.  Follow your health care provider's instructions on monitoring your cholesterol and blood pressure. This information is not intended to replace advice given to you by your health care provider. Make sure you discuss any questions you have with your health care provider. Document Released: 07/12/2010 Document Revised: 12/20/2017 Document Reviewed: 12/20/2017 [employer] Patient Education  2020 [employer]  

## 2018-11-08 NOTE — Patient Instructions (Signed)
EXERCISE AND DIET:  We recommended that you start or continue a regular exercise program for good health. Regular exercise means any activity that makes your heart beat faster and makes you sweat.  We recommend exercising at least 30 minutes per day at least 3 days a week, preferably 4 or 5.  We also recommend a diet low in fat and sugar.  Inactivity, poor dietary choices and obesity can cause diabetes, heart attack, stroke, and kidney damage, among others.   ° °ALCOHOL AND SMOKING:  Women should limit their alcohol intake to no more than 7 drinks/beers/glasses of wine (combined, not each!) per week. Moderation of alcohol intake to this level decreases your risk of breast cancer and liver damage. And of course, no recreational drugs are part of a healthy lifestyle.  And absolutely no smoking or even second hand smoke. Most people know smoking can cause heart and lung diseases, but did you know it also contributes to weakening of your bones? Aging of your skin?  Yellowing of your teeth and nails? ° °CALCIUM AND VITAMIN D:  Adequate intake of calcium and Vitamin D are recommended.  The recommendations for exact amounts of these supplements seem to change often, but generally speaking 1,200 mg of calcium (between diet and supplement) and 800 units of Vitamin D per day seems prudent. Certain women may benefit from higher intake of Vitamin D.  If you are among these women, your doctor will have told you during your visit.   ° °PAP SMEARS:  Pap smears, to check for cervical cancer or precancers,  have traditionally been done yearly, although recent scientific advances have shown that most women can have pap smears less often.  However, every woman still should have a physical exam from her gynecologist every year. It will include a breast check, inspection of the vulva and vagina to check for abnormal growths or skin changes, a visual exam of the cervix, and then an exam to evaluate the size and shape of the uterus and  ovaries.  And after 64 years of age, a rectal exam is indicated to check for rectal cancers. We will also provide age appropriate advice regarding health maintenance, like when you should have certain vaccines, screening for sexually transmitted diseases, bone density testing, colonoscopy, mammograms, etc.  ° °MAMMOGRAMS:  All women over 40 years old should have a yearly mammogram. Many facilities now offer a "3D" mammogram, which may cost around $50 extra out of pocket. If possible,  we recommend you accept the option to have the 3D mammogram performed.  It both reduces the number of women who will be called back for extra views which then turn out to be normal, and it is better than the routine mammogram at detecting truly abnormal areas.   ° °COLON CANCER SCREENING: Now recommend starting at age 45. At this time colonoscopy is not covered for routine screening until 50. There are take home tests that can be done between 45-49.  ° °COLONOSCOPY:  Colonoscopy to screen for colon cancer is recommended for all women at age 50.  We know, you hate the idea of the prep.  We agree, BUT, having colon cancer and not knowing it is worse!!  Colon cancer so often starts as a polyp that can be seen and removed at colonscopy, which can quite literally save your life!  And if your first colonoscopy is normal and you have no family history of colon cancer, most women don't have to have it again for   10 years.  Once every ten years, you can do something that may end up saving your life, right?  We will be happy to help you get it scheduled when you are ready.  Be sure to check your insurance coverage so you understand how much it will cost.  It may be covered as a preventative service at no cost, but you should check your particular policy.   ° ° ° °Breast Self-Awareness °Breast self-awareness means being familiar with how your breasts look and feel. It involves checking your breasts regularly and reporting any changes to your  health care provider. °Practicing breast self-awareness is important. A change in your breasts can be a sign of a serious medical problem. Being familiar with how your breasts look and feel allows you to find any problems early, when treatment is more likely to be successful. All women should practice breast self-awareness, including women who have had breast implants. °How to do a breast self-exam °One way to learn what is normal for your breasts and whether your breasts are changing is to do a breast self-exam. To do a breast self-exam: °Look for Changes ° °1. Remove all the clothing above your waist. °2. Stand in front of a mirror in a room with good lighting. °3. Put your hands on your hips. °4. Push your hands firmly downward. °5. Compare your breasts in the mirror. Look for differences between them (asymmetry), such as: °? Differences in shape. °? Differences in size. °? Puckers, dips, and bumps in one breast and not the other. °6. Look at each breast for changes in your skin, such as: °? Redness. °? Scaly areas. °7. Look for changes in your nipples, such as: °? Discharge. °? Bleeding. °? Dimpling. °? Redness. °? A change in position. °Feel for Changes °Carefully feel your breasts for lumps and changes. It is best to do this while lying on your back on the floor and again while sitting or standing in the shower or tub with soapy water on your skin. Feel each breast in the following way: °· Place the arm on the side of the breast you are examining above your head. °· Feel your breast with the other hand. °· Start in the nipple area and make ¾ inch (2 cm) overlapping circles to feel your breast. Use the pads of your three middle fingers to do this. Apply light pressure, then medium pressure, then firm pressure. The light pressure will allow you to feel the tissue closest to the skin. The medium pressure will allow you to feel the tissue that is a little deeper. The firm pressure will allow you to feel the tissue  close to the ribs. °· Continue the overlapping circles, moving downward over the breast until you feel your ribs below your breast. °· Move one finger-width toward the center of the body. Continue to use the ¾ inch (2 cm) overlapping circles to feel your breast as you move slowly up toward your collarbone. °· Continue the up and down exam using all three pressures until you reach your armpit. ° °Write Down What You Find ° °Write down what is normal for each breast and any changes that you find. Keep a written record with breast changes or normal findings for each breast. By writing this information down, you do not need to depend only on memory for size, tenderness, or location. Write down where you are in your menstrual cycle, if you are still menstruating. °If you are having trouble noticing differences   in your breasts, do not get discouraged. With time you will become more familiar with the variations in your breasts and more comfortable with the exam. °How often should I examine my breasts? °Examine your breasts every month. If you are breastfeeding, the best time to examine your breasts is after a feeding or after using a breast pump. If you menstruate, the best time to examine your breasts is 5-7 days after your period is over. During your period, your breasts are lumpier, and it may be more difficult to notice changes. °When should I see my health care provider? °See your health care provider if you notice: °· A change in shape or size of your breasts or nipples. °· A change in the skin of your breast or nipples, such as a reddened or scaly area. °· Unusual discharge from your nipples. °· A lump or thick area that was not there before. °· Pain in your breasts. °· Anything that concerns you. ° ° °Osteoporosis ° °Osteoporosis is thinning and loss of density in your bones. Osteoporosis makes bones more brittle and fragile and more likely to break (fracture). Over time, osteoporosis can cause your bones to become  so weak that they fracture after a minor fall. Bones in the hip, wrist, and spine are most likely to fracture due to osteoporosis. °What are the causes? °The exact cause of this condition is not known. °What increases the risk? °You may be at greater risk for osteoporosis if you: °· Have a family history of the condition. °· Have poor nutrition. °· Use steroid medicines, such as prednisone. °· Are female. °· Are age 50 or older. °· Smoke or have a history of smoking. °· Are not physically active (are sedentary). °· Are white (Caucasian) or of Asian descent. °· Have a small body frame. °· Take certain medicines, such as antiseizure medicines. °What are the signs or symptoms? °A fracture might be the first sign of osteoporosis, especially if the fracture results from a fall or injury that usually would not cause a bone to break. Other signs and symptoms include: °· Pain in the neck or low back. °· Stooped posture. °· Loss of height. °How is this diagnosed? °This condition may be diagnosed based on: °· Your medical history. °· A physical exam. °· A bone mineral density test, also called a DXA or DEXA test (dual-energy X-ray absorptiometry test). This test uses X-rays to measure the amount of minerals in your bones. °How is this treated? °The goal of treatment is to strengthen your bones and lower your risk for a fracture. Treatment may involve: °· Making lifestyle changes, such as: °? Including foods with more calcium and vitamin D in your diet. °? Doing weight-bearing and muscle-strengthening exercises. °? Stopping tobacco use. °? Limiting alcohol intake. °· Taking medicine to slow the process of bone loss or to increase bone density. °· Taking daily supplements of calcium and vitamin D. °· Taking hormone replacement medicines, such as estrogen for women and testosterone for men. °· Monitoring your levels of calcium and vitamin D. °Follow these instructions at home: ° °Activity °· Exercise as told by your health care  provider. Ask your health care provider what exercises and activities are safe for you. You should do: °? Exercises that make you work against gravity (weight-bearing exercises), such as tai chi, yoga, or walking. °? Exercises to strengthen muscles, such as lifting weights. °Lifestyle °· Limit alcohol intake to no more than 1 drink a day for nonpregnant women and   2 drinks a day for men. One drink equals 12 oz of beer, 5 oz of wine, or 1½ oz of hard liquor. °· Do not use any products that contain nicotine or tobacco, such as cigarettes and e-cigarettes. If you need help quitting, ask your health care provider. °Preventing falls °· Use devices to help you move around (mobility aids) as needed, such as canes, walkers, scooters, or crutches. °· Keep rooms well-lit and clutter-free. °· Remove tripping hazards from walkways, including cords and throw rugs. °· Install grab bars in bathrooms and safety rails on stairs. °· Use rubber mats in the bathroom and other areas that are often wet or slippery. °· Wear closed-toe shoes that fit well and support your feet. Wear shoes that have rubber soles or low heels. °· Review your medicines with your health care provider. Some medicines can cause dizziness or changes in blood pressure, which can increase your risk of falling. °General instructions °· Include calcium and vitamin D in your diet. Calcium is important for bone health, and vitamin D helps your body to absorb calcium. Good sources of calcium and vitamin D include: °? Certain fatty fish, such as salmon and tuna. °? Products that have calcium and vitamin D added to them (fortified products), such as fortified cereals. °? Egg yolks. °? Cheese. °? Liver. °· Take over-the-counter and prescription medicines only as told by your health care provider. °· Keep all follow-up visits as told by your health care provider. This is important. °Contact a health care provider if: °· You have never been screened for osteoporosis and you  are: °? A woman who is age 65 or older. °? A man who is age 70 or older. °Get help right away if: °· You fall or injure yourself. °Summary °· Osteoporosis is thinning and loss of density in your bones. This makes bones more brittle and fragile and more likely to break (fracture),even with minor falls. °· The goal of treatment is to strengthen your bones and reduce your risk for a fracture. °· Include calcium and vitamin D in your diet. Calcium is important for bone health, and vitamin D helps your body to absorb calcium. °· Talk with your health care provider about screening for osteoporosis if you are a woman who is age 65 or older, or a man who is age 70 or older. °This information is not intended to replace advice given to you by your health care provider. Make sure you discuss any questions you have with your health care provider. °Document Released: 10/06/2004 Document Revised: 12/09/2016 Document Reviewed: 10/21/2016 °Elsevier Patient Education © 2020 [employer] ° °

## 2018-11-09 LAB — VITAMIN D 25 HYDROXY (VIT D DEFICIENCY, FRACTURES): VITD: 29.79 ng/mL — ABNORMAL LOW (ref 30.00–100.00)

## 2018-11-09 NOTE — Assessment & Plan Note (Signed)
Doing well on protonix and will continue.

## 2018-11-09 NOTE — Assessment & Plan Note (Signed)
Checking vitamin D level.  

## 2018-11-09 NOTE — Assessment & Plan Note (Signed)
Flu shot up to date. Shingrix complete. Tetanus up to date. Colonoscopy up to date. Mammogram up to date, pap smear up to date. Counseled about sun safety and mole surveillance. Counseled about the dangers of distracted driving. Given 10 year screening recommendations.   

## 2019-01-10 ENCOUNTER — Encounter

## 2019-01-28 DIAGNOSIS — H35372 Puckering of macula, left eye: Secondary | ICD-10-CM | POA: Diagnosis not present

## 2019-01-28 DIAGNOSIS — H5203 Hypermetropia, bilateral: Secondary | ICD-10-CM | POA: Diagnosis not present

## 2019-11-11 NOTE — Progress Notes (Signed)
64 y.o. G0P0000 Single White or Caucasian Not Hispanic or Latino female here for annual exam.  Lives with no her partner. No vaginal bleeding.     H/O osteoporosis, no treatment.  Patient's last menstrual period was 01/10/2006.          Sexually active: No.  The current method of family planning is post menopausal status.    Exercising: No.  The patient does not participate in regular exercise at present. Smoker:  no  Health Maintenance: Pap:  10-19-15 Neg:Neg HR HPV, 06-11-11 Neg History of abnormal Pap:  no MMG:  11/08/18 density C Bi-rads 1 neg  BMD:   04-27-16 Osteoporosis. T score of -2.7 in the left hip. No statistically significant change since her prior BMD in 5/11 Colonoscopy: 05-10-16 polyps;next 5 years TDaP:  03/20/17 Gardasil: N/A    reports that she has never smoked. She has never used smokeless tobacco. She reports current alcohol use. She reports that she does not use drugs. Drinks 2 bottles of wine a week. 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 She helps takes care of a disabled brother, lives on his own.   Past Medical History:  Diagnosis Date  . GERD (gastroesophageal reflux disease)    takes Omeprazole and Protonix daily  . H/O hiatal hernia   . History of bronchitis    15+yrs ago  . History of colon polyps   . History of vertigo    takes Antivert daily as needed  . Joint pain   . Meniere's disease    L  hearing loss related to same  . Osteoporosis   . PMB (postmenopausal bleeding) 12/07   proliferative endo. No HRT, PUS normal    Past Surgical History:  Procedure Laterality Date  . ABSCESS DRAINAGE  09/2010   suprapubic  . BTSP  1994  . CATARACT EXTRACTION EXTRACAPSULAR Right 10/2014  . COLONOSCOPY W/ BIOPSIES  12/12/05   hyperplastic polyp, recheck in 10 years  . CYSTECTOMY     from buttock  . GAS/FLUID EXCHANGE Right 12/23/2013   Procedure: GAS/FLUID EXCHANGE;  Surgeon: Corliss Parish, MD;  Location: [facility];  Service:  Ophthalmology;  Laterality: Right;  SF6  . [place]  . MEMBRANE PEEL Right 12/23/2013   Procedure: MEMBRANE PEEL;  Surgeon: Corliss Parish, MD;  Location: [facility];  Service: Ophthalmology;  Laterality: Right;  . PARS PLANA VITRECTOMY Right 10/07/2013   Procedure: PARS PLANA VITRECTOMY WITH 25 GAUGE RIGHT EYE;  Surgeon: Corliss Parish, MD;  Location: [facility];  Service: Ophthalmology;  Laterality: Right;  . PARS PLANA VITRECTOMY Right 12/23/2013   Procedure: PARS PLANA VITRECTOMY WITH 25 GAUGE;  Surgeon: Corliss Parish, MD;  Location: [facility];  Service: Ophthalmology;  Laterality: Right;  . TUBAL LIGATION      Current Outpatient Medications  Medication Sig Dispense Refill  . fluticasone (FLONASE) 50 MCG/ACT nasal spray Place 2 sprays into both nostrils daily. 16 g 6  . Multiple Vitamins-Minerals (MULTIVITAMIN WITH MINERALS) tablet Take 1 tablet by mouth daily.      . pantoprazole (PROTONIX) 40 MG tablet TAKE 1 TABLET BY MOUTH ONCE DAILY AS NEEDED FOR ACID REFLUX 90 tablet 3   No current facility-administered medications for this visit.    Family History  Problem Relation Age of Onset  . Hypertension Mother   . Atrial fibrillation Mother   . Deep vein thrombosis Mother   . Osteoporosis  Mother        compression fx  . Thyroid disease Mother   . Osteoarthritis Mother   . Arthritis Mother        shoulder replacement  . Stroke Father   . Anorexia nervosa Sister   . Osteoporosis Sister   . Thyroid disease Sister   . Diabetes Brother   . Cancer Paternal Grandmother        multi-organ    Review of Systems  All other systems reviewed and are negative.   Exam:   LMP 01/10/2006   Weight change: @WEIGHTCHANGE @ Height:      Ht Readings from Last 3 Encounters:  11/08/18 5\' 4"  (1.626 m)  11/08/18 5\' 4"  (1.626 m)  08/23/17 5' 3.5" (1.613 m)    General appearance: alert, cooperative and appears stated age Head: Normocephalic, without obvious  abnormality, atraumatic Neck: no adenopathy, supple, symmetrical, trachea midline and thyroid normal to inspection and palpation Lungs: clear to auscultation bilaterally Cardiovascular: regular rate and rhythm Breasts: normal appearance, no masses or tenderness Abdomen: soft, non-tender; non distended,  no masses,  no organomegaly Extremities: extremities normal, atraumatic, no cyanosis or edema Skin: Skin color, texture, turgor normal. No rashes or lesions Lymph nodes: Cervical, supraclavicular, and axillary nodes normal. No abnormal inguinal nodes palpated Neurologic: Grossly normal   Pelvic: External genitalia:  no lesions              Urethra:  normal appearing urethra with no masses, tenderness or lesions              Bartholins and Skenes: normal                 Vagina: atrophic appearing vagina with normal color and discharge, no lesions              Cervix: no lesions               Bimanual Exam:  Uterus:  normal size, contour, position, consistency, mobility, non-tender              Adnexa: no mass, fullness, tenderness               Rectovaginal: Confirms               Anus:  normal sphincter tone, no lesions  Gae Dry chaperoned for the exam.  A:  Well Woman with normal exam  Osteoporosis, not on medication  Vit d def  P:   Pap next year  Discussed breast self exam  Discussed calcium and vit D intake  Mammogram and dexa due  Will further discuss osteoporosis after her dexa

## 2019-11-14 ENCOUNTER — Encounter: Payer: Self-pay | Admitting: Obstetrics and Gynecology

## 2019-11-14 ENCOUNTER — Ambulatory Visit (INDEPENDENT_AMBULATORY_CARE_PROVIDER_SITE_OTHER): Payer: 59 | Admitting: Obstetrics and Gynecology

## 2019-11-14 ENCOUNTER — Other Ambulatory Visit: Payer: Self-pay

## 2019-11-14 VITALS — BP 122/74 | HR 70 | Ht 63.75 in | Wt 138.0 lb

## 2019-11-14 DIAGNOSIS — Z01419 Encounter for gynecological examination (general) (routine) without abnormal findings: Secondary | ICD-10-CM | POA: Diagnosis not present

## 2019-11-14 DIAGNOSIS — M810 Age-related osteoporosis without current pathological fracture: Secondary | ICD-10-CM

## 2019-11-14 DIAGNOSIS — Z0000 Encounter for general adult medical examination without abnormal findings: Secondary | ICD-10-CM | POA: Diagnosis not present

## 2019-11-14 DIAGNOSIS — E559 Vitamin D deficiency, unspecified: Secondary | ICD-10-CM

## 2019-11-14 NOTE — Patient Instructions (Addendum)
Osteoporosis  Osteoporosis is thinning and loss of density in your bones. Osteoporosis makes bones more brittle and fragile and more likely to break (fracture). Over time, osteoporosis can cause your bones to become so weak that they fracture after a minor fall. Bones in the hip, wrist, and spine are most likely to fracture due to osteoporosis. What are the causes? The exact cause of this condition is not known. What increases the risk? You may be at greater risk for osteoporosis if you:  Have a family history of the condition.  Have poor nutrition.  Use steroid medicines, such as prednisone.  Are female.  Are age 65 or older.  Smoke or have a history of smoking.  Are not physically active (are sedentary).  Are white (Caucasian) or of Asian descent.  Have a small body frame.  Take certain medicines, such as antiseizure medicines. What are the signs or symptoms? A fracture might be the first sign of osteoporosis, especially if the fracture results from a fall or injury that usually would not cause a bone to break. Other signs and symptoms include:  Pain in the neck or low back.  Stooped posture.  Loss of height. How is this diagnosed? This condition may be diagnosed based on:  Your medical history.  A physical exam.  A bone mineral density test, also called a DXA or DEXA test (dual-energy X-ray absorptiometry test). This test uses X-rays to measure the amount of minerals in your bones. How is this treated? The goal of treatment is to strengthen your bones and lower your risk for a fracture. Treatment may involve:  Making lifestyle changes, such as: ? Including foods with more calcium and vitamin D in your diet. ? Doing weight-bearing and muscle-strengthening exercises. ? Stopping tobacco use. ? Limiting alcohol intake.  Taking medicine to slow the process of bone loss or to increase bone density.  Taking daily supplements of calcium and vitamin D.  Taking  hormone replacement medicines, such as estrogen for women and testosterone for men.  Monitoring your levels of calcium and vitamin D. Follow these instructions at home:  Activity  Exercise as told by your health care provider. Ask your health care provider what exercises and activities are safe for you. You should do: ? Exercises that make you work against gravity (weight-bearing exercises), such as tai chi, yoga, or walking. ? Exercises to strengthen muscles, such as lifting weights. Lifestyle  Limit alcohol intake to no more than 1 drink a day for nonpregnant women and 2 drinks a day for men. One drink equals 12 oz of beer, 5 oz of wine, or 1 oz of hard liquor.  Do not use any products that contain nicotine or tobacco, such as cigarettes and e-cigarettes. If you need help quitting, ask your health care provider. Preventing falls  Use devices to help you move around (mobility aids) as needed, such as canes, walkers, scooters, or crutches.  Keep rooms well-lit and clutter-free.  Remove tripping hazards from walkways, including cords and throw rugs.  Install grab bars in bathrooms and safety rails on stairs.  Use rubber mats in the bathroom and other areas that are often wet or slippery.  Wear closed-toe shoes that fit well and support your feet. Wear shoes that have rubber soles or low heels.  Review your medicines with your health care provider. Some medicines can cause dizziness or changes in blood pressure, which can increase your risk of falling. General instructions  Include calcium and vitamin D in  your diet. Calcium is important for bone health, and vitamin D helps your body to absorb calcium. Good sources of calcium and vitamin D include: ? Certain fatty fish, such as salmon and tuna. ? Products that have calcium and vitamin D added to them (fortified products), such as fortified cereals. ? Egg yolks. ? Cheese. ? Liver.  Take over-the-counter and prescription medicines  only as told by your health care provider.  Keep all follow-up visits as told by your health care provider. This is important. Contact a health care provider if:  You have never been screened for osteoporosis and you are: ? A woman who is age 65 or older. ? A man who is age 65 or older. Get help right away if:  You fall or injure yourself. Summary  Osteoporosis is thinning and loss of density in your bones. This makes bones more brittle and fragile and more likely to break (fracture),even with minor falls.  The goal of treatment is to strengthen your bones and reduce your risk for a fracture.  Include calcium and vitamin D in your diet. Calcium is important for bone health, and vitamin D helps your body to absorb calcium.  Talk with your health care provider about screening for osteoporosis if you are a woman who is age 75 or older, or a man who is age 56 or older. This information is not intended to replace advice given to you by your health care provider. Make sure you discuss any questions you have with your health care provider. Document Revised: 12/09/2016 Document Reviewed: 10/21/2016 Elsevier Patient Education  2020 [employer]   EXERCISE AND DIET:  We recommended that you start or continue a regular exercise program for good health. Regular exercise means any activity that makes your heart beat faster and makes you sweat.  We recommend exercising at least 30 minutes per day at least 3 days a week, preferably 4 or 5.  We also recommend a diet low in fat and sugar.  Inactivity, poor dietary choices and obesity can cause diabetes, heart attack, stroke, and kidney damage, among others.    ALCOHOL AND SMOKING:  Women should limit their alcohol intake to no more than 7 drinks/beers/glasses of wine (combined, not each!) per week. Moderation of alcohol intake to this level decreases your risk of breast cancer and liver damage. And of course, no recreational drugs are part of a healthy  lifestyle.  And absolutely no smoking or even second hand smoke. Most people know smoking can cause heart and lung diseases, but did you know it also contributes to weakening of your bones? Aging of your skin?  Yellowing of your teeth and nails?  CALCIUM AND VITAMIN D:  Adequate intake of calcium and Vitamin D are recommended.  The recommendations for exact amounts of these supplements seem to change often, but generally speaking 1,200 mg of calcium (between diet and supplement) and 800 units of Vitamin D per day seems prudent. Certain women may benefit from higher intake of Vitamin D.  If you are among these women, your doctor will have told you during your visit.    PAP SMEARS:  Pap smears, to check for cervical cancer or precancers,  have traditionally been done yearly, although recent scientific advances have shown that most women can have pap smears less often.  However, every woman still should have a physical exam from her gynecologist every year. It will include a breast check, inspection of the vulva and vagina to check for abnormal  growths or skin changes, a visual exam of the cervix, and then an exam to evaluate the size and shape of the uterus and ovaries.  And after 65 years of age, a rectal exam is indicated to check for rectal cancers. We will also provide age appropriate advice regarding health maintenance, like when you should have certain vaccines, screening for sexually transmitted diseases, bone density testing, colonoscopy, mammograms, etc.   MAMMOGRAMS:  All women over 75 years old should have a yearly mammogram. Many facilities now offer a "3D" mammogram, which may cost around $50 extra out of pocket. If possible,  we recommend you accept the option to have the 3D mammogram performed.  It both reduces the number of women who will be called back for extra views which then turn out to be normal, and it is better than the routine mammogram at detecting truly abnormal areas.    COLON  CANCER SCREENING: Now recommend starting at age 15. At this time colonoscopy is not covered for routine screening until 50. There are take home tests that can be done between 45-49.   COLONOSCOPY:  Colonoscopy to screen for colon cancer is recommended for all women at age 22.  We know, you hate the idea of the prep.  We agree, BUT, having colon cancer and not knowing it is worse!!  Colon cancer so often starts as a polyp that can be seen and removed at colonscopy, which can quite literally save your life!  And if your first colonoscopy is normal and you have no family history of colon cancer, most women don't have to have it again for 10 years.  Once every ten years, you can do something that may end up saving your life, right?  We will be happy to help you get it scheduled when you are ready.  Be sure to check your insurance coverage so you understand how much it will cost.  It may be covered as a preventative service at no cost, but you should check your particular policy.      Breast Self-Awareness Breast self-awareness means being familiar with how your breasts look and feel. It involves checking your breasts regularly and reporting any changes to your health care provider. Practicing breast self-awareness is important. A change in your breasts can be a sign of a serious medical problem. Being familiar with how your breasts look and feel allows you to find any problems early, when treatment is more likely to be successful. All women should practice breast self-awareness, including women who have had breast implants. How to do a breast self-exam One way to learn what is normal for your breasts and whether your breasts are changing is to do a breast self-exam. To do a breast self-exam: Look for Changes  1. Remove all the clothing above your waist. 2. Stand in front of a mirror in a room with good lighting. 3. Put your hands on your hips. 4. Push your hands firmly downward. 5. Compare your breasts  in the mirror. Look for differences between them (asymmetry), such as: ? Differences in shape. ? Differences in size. ? Puckers, dips, and bumps in one breast and not the other. 6. Look at each breast for changes in your skin, such as: ? Redness. ? Scaly areas. 7. Look for changes in your nipples, such as: ? Discharge. ? Bleeding. ? Dimpling. ? Redness. ? A change in position. Feel for Changes Carefully feel your breasts for lumps and changes. It is best to do  this while lying on your back on the floor and again while sitting or standing in the shower or tub with soapy water on your skin. Feel each breast in the following way:  Place the arm on the side of the breast you are examining above your head.  Feel your breast with the other hand.  Start in the nipple area and make  inch (2 cm) overlapping circles to feel your breast. Use the pads of your three middle fingers to do this. Apply light pressure, then medium pressure, then firm pressure. The light pressure will allow you to feel the tissue closest to the skin. The medium pressure will allow you to feel the tissue that is a little deeper. The firm pressure will allow you to feel the tissue close to the ribs.  Continue the overlapping circles, moving downward over the breast until you feel your ribs below your breast.  Move one finger-width toward the center of the body. Continue to use the  inch (2 cm) overlapping circles to feel your breast as you move slowly up toward your collarbone.  Continue the up and down exam using all three pressures until you reach your armpit.  Write Down What You Find  Write down what is normal for each breast and any changes that you find. Keep a written record with breast changes or normal findings for each breast. By writing this information down, you do not need to depend only on memory for size, tenderness, or location. Write down where you are in your menstrual cycle, if you are still  menstruating. If you are having trouble noticing differences in your breasts, do not get discouraged. With time you will become more familiar with the variations in your breasts and more comfortable with the exam. How often should I examine my breasts? Examine your breasts every month. If you are breastfeeding, the best time to examine your breasts is after a feeding or after using a breast pump. If you menstruate, the best time to examine your breasts is 5-7 days after your period is over. During your period, your breasts are lumpier, and it may be more difficult to notice changes. When should I see my health care provider? See your health care provider if you notice:  A change in shape or size of your breasts or nipples.  A change in the skin of your breast or nipples, such as a reddened or scaly area.  Unusual discharge from your nipples.  A lump or thick area that was not there before.  Pain in your breasts.  Anything that concerns you.

## 2019-11-15 ENCOUNTER — Other Ambulatory Visit: Payer: Self-pay | Admitting: Obstetrics and Gynecology

## 2019-11-15 ENCOUNTER — Encounter: Payer: Self-pay | Admitting: Obstetrics and Gynecology

## 2019-11-15 DIAGNOSIS — Z1231 Encounter for screening mammogram for malignant neoplasm of breast: Secondary | ICD-10-CM

## 2019-11-15 LAB — CBC
Hematocrit: 39.1 % (ref 34.0–46.6)
Hemoglobin: 13 g/dL (ref 11.1–15.9)
MCH: 31.4 pg (ref 26.6–33.0)
MCHC: 33.2 g/dL (ref 31.5–35.7)
MCV: 94 fL (ref 79–97)
Platelets: 241 10*3/uL (ref 150–450)
RBC: 4.14 x10E6/uL (ref 3.77–5.28)
RDW: 12.2 % (ref 11.7–15.4)
WBC: 6.4 10*3/uL (ref 3.4–10.8)

## 2019-11-15 LAB — LIPID PANEL
Chol/HDL Ratio: 3 ratio (ref 0.0–4.4)
Cholesterol, Total: 213 mg/dL — ABNORMAL HIGH (ref 100–199)
HDL: 72 mg/dL (ref >39)
LDL Chol Calc (NIH): 110 mg/dL — ABNORMAL HIGH (ref 0–99)
Triglycerides: 180 mg/dL — ABNORMAL HIGH (ref 0–149)
VLDL Cholesterol Cal: 31 mg/dL (ref 5–40)

## 2019-11-15 LAB — COMPREHENSIVE METABOLIC PANEL
ALT: 14 IU/L (ref 0–32)
AST: 17 IU/L (ref 0–40)
Albumin/Globulin Ratio: 1.8 (ref 1.2–2.2)
Albumin: 4.2 g/dL (ref 3.8–4.8)
Alkaline Phosphatase: 55 IU/L (ref 44–121)
BUN/Creatinine Ratio: 21 (ref 12–28)
BUN: 16 mg/dL (ref 8–27)
Bilirubin Total: 0.3 mg/dL (ref 0.0–1.2)
CO2: 25 mmol/L (ref 20–29)
Calcium: 8.7 mg/dL (ref 8.7–10.3)
Chloride: 103 mmol/L (ref 96–106)
Creatinine, Ser: 0.75 mg/dL (ref 0.57–1.00)
GFR calc Af Amer: 97 mL/min/{1.73_m2} (ref >59)
GFR calc non Af Amer: 84 mL/min/{1.73_m2} (ref >59)
Globulin, Total: 2.4 g/dL (ref 1.5–4.5)
Glucose: 78 mg/dL (ref 65–99)
Potassium: 4.1 mmol/L (ref 3.5–5.2)
Sodium: 140 mmol/L (ref 134–144)
Total Protein: 6.6 g/dL (ref 6.0–8.5)

## 2019-11-15 LAB — VITAMIN D 25 HYDROXY (VIT D DEFICIENCY, FRACTURES): Vit D, 25-Hydroxy: 26.3 ng/mL — ABNORMAL LOW (ref 30.0–100.0)

## 2019-11-20 ENCOUNTER — Telehealth: Payer: Self-pay

## 2019-11-20 NOTE — Telephone Encounter (Signed)
Pt sent the following mychart message:  Kathleen, Garza [facility] Clinical Pool What supplement for low vit D should I take?

## 2019-11-20 NOTE — Telephone Encounter (Signed)
Please see result note 

## 2019-11-20 NOTE — Telephone Encounter (Signed)
Results are back from 11/14/19  Routing to Dr Talbert Nan, please advise

## 2019-11-21 NOTE — Telephone Encounter (Signed)
Left message to call Estill Bamberg, CMA or recheck her MyChart message for recommendations.

## 2019-11-21 NOTE — Telephone Encounter (Signed)
Left detailed message per DPR. Pt given results and recommendations per Dr Talbert Nan as below. Pt to return a call to office with any additional questions or concerns.  Encounter closed   Hi [name], Your triglycerides were mildly elevated, this could be explained by having eaten prior to your appointment. Your LDL (bad cholesterol) is mildly elevated, but your HDL (good cholesterol) and cholesterol ratio are good. You should eat a diet low in saturated fats and exercise regularly. Your vit d is mildly low. I would recommend that you increase your current vit d intake by 1,000 IU a day (long term).  The rest of your blood work is normal. Please call with any questions. Have a great evening! Sumner Boast  Written by Salvadore Dom, MD on 11/20/2019 4:36 PM EST

## 2019-11-22 ENCOUNTER — Ambulatory Visit
Admission: RE | Admit: 2019-11-22 | Discharge: 2019-11-22 | Disposition: A | Discharge status: Home / Self Care | Payer: 59 | Source: Ambulatory Visit | Attending: Obstetrics and Gynecology | Admitting: Obstetrics and Gynecology

## 2019-11-22 ENCOUNTER — Other Ambulatory Visit: Payer: Self-pay

## 2019-11-22 DIAGNOSIS — M810 Age-related osteoporosis without current pathological fracture: Secondary | ICD-10-CM

## 2019-11-22 DIAGNOSIS — Z780 Asymptomatic menopausal state: Secondary | ICD-10-CM | POA: Diagnosis not present

## 2019-11-22 DIAGNOSIS — M8589 Other specified disorders of bone density and structure, multiple sites: Secondary | ICD-10-CM | POA: Diagnosis not present

## 2019-12-27 ENCOUNTER — Ambulatory Visit: Payer: 59

## 2020-01-30 DIAGNOSIS — H52203 Unspecified astigmatism, bilateral: Secondary | ICD-10-CM | POA: Diagnosis not present

## 2020-01-30 DIAGNOSIS — H35372 Puckering of macula, left eye: Secondary | ICD-10-CM | POA: Diagnosis not present

## 2020-02-11 ENCOUNTER — Ambulatory Visit: Payer: 59

## 2020-03-09 ENCOUNTER — Ambulatory Visit: Payer: 59

## 2020-03-13 ENCOUNTER — Other Ambulatory Visit: Payer: Self-pay

## 2020-03-13 ENCOUNTER — Ambulatory Visit
Admission: RE | Admit: 2020-03-13 | Discharge: 2020-03-13 | Disposition: A | Discharge status: Home / Self Care | Payer: 59 | Source: Ambulatory Visit | Attending: Obstetrics and Gynecology | Admitting: Obstetrics and Gynecology

## 2020-03-13 DIAGNOSIS — Z1231 Encounter for screening mammogram for malignant neoplasm of breast: Secondary | ICD-10-CM

## 2020-04-25 ENCOUNTER — Emergency Department: Admit: 2020-04-25 | Discharge status: Absent without leave | Payer: Self-pay

## 2020-04-25 ENCOUNTER — Other Ambulatory Visit: Payer: Self-pay

## 2020-04-25 ENCOUNTER — Emergency Department (INDEPENDENT_AMBULATORY_CARE_PROVIDER_SITE_OTHER)
Admission: EM | Admit: 2020-04-25 | Discharge: 2020-04-25 | Disposition: A | Discharge status: Home / Self Care | Payer: 59 | Source: Home / Self Care

## 2020-04-25 ENCOUNTER — Encounter: Payer: Self-pay | Admitting: Emergency Medicine

## 2020-04-25 DIAGNOSIS — H0100A Unspecified blepharitis right eye, upper and lower eyelids: Secondary | ICD-10-CM

## 2020-04-25 DIAGNOSIS — H02843 Edema of right eye, unspecified eyelid: Secondary | ICD-10-CM

## 2020-04-25 DIAGNOSIS — H1031 Unspecified acute conjunctivitis, right eye: Secondary | ICD-10-CM

## 2020-04-25 MED ORDER — TOBRAMYCIN-DEXAMETHASONE 0.3-0.1 % OP SUSP: 1.0000 [drp] | OPHTHALMIC | 0 refills | Status: AC

## 2020-04-25 MED ORDER — DEXAMETHASONE SODIUM PHOSPHATE 10 MG/ML IJ SOLN
10.0000 mg | Freq: Once | INTRAMUSCULAR | Status: AC
  Administered 2020-04-25: 10 mg via INTRAMUSCULAR

## 2020-04-25 NOTE — ED Triage Notes (Addendum)
Right eyelid swelling x 3 days  Eye irritated after working  In the garden  Swelling started on Friday  COVID vaccine

## 2020-04-25 NOTE — Discharge Instructions (Signed)
You have received a steroid injection in the office today to help with the swelling around your right eye  I have sent in tobradex drops for you to use one drop every 4 hours to the right eye. May use 2 drops for the first 2 doses.  Follow up with this office or with primary care if symptoms are persisting.  Follow up in the ER for high fever, trouble swallowing, trouble breathing, other concerning symptoms.

## 2020-04-25 NOTE — ED Provider Notes (Signed)
[facility]    CSN: [id number] Arrival date & time: 04/25/20  0904      History   Chief Complaint Chief Complaint  Patient presents with  . Facial Swelling    HPI [name] is a 65 y.o. female.   Reports R eye swelling, itching, drainage x 3 days. Reports that she was working outside and may have gotten something in her eye. Has been using saline drops with little relief. Denies previous symptoms. Denies pain with ocular movement. Reports decrease in visual acuity due to eyelid swelling and partially covering her pupil. Denies sick contacts. Denies eye pain, loss of vision, obvious foreign body, rash, fever, other symptoms.  ROS per HPI  The history is provided by the patient.    Past Medical History:  Diagnosis Date  . GERD (gastroesophageal reflux disease)    takes Omeprazole and Protonix daily  . H/O hiatal hernia   . History of bronchitis    15+yrs ago  . History of colon polyps   . History of vertigo    takes Antivert daily as needed  . Joint pain   . Meniere's disease    L  hearing loss related to same  . Osteoporosis   . PMB (postmenopausal bleeding) 12/07   proliferative endo. No HRT, PUS normal    Patient Active Problem List   Diagnosis Date Noted  . Acquired trigger finger of left middle finger 06/08/2018  . Arthritis of finger of left hand 06/08/2018  . Vitamin D deficiency 04/06/2017  . Routine general medical examination at a health care facility 03/28/2014  . Osteoporosis, unspecified 08/24/2012  . Tinnitus 03/12/2010  . GERD 03/11/2010    Past Surgical History:  Procedure Laterality Date  . ABSCESS DRAINAGE  09/2010   suprapubic  . BTSP  1994  . CATARACT EXTRACTION EXTRACAPSULAR Right 10/2014  . COLONOSCOPY W/ BIOPSIES  12/12/05   hyperplastic polyp, recheck in 10 years  . CYSTECTOMY     from buttock  . GAS/FLUID EXCHANGE Right 12/23/2013   Procedure: GAS/FLUID EXCHANGE;  Surgeon: Corliss Parish, MD;  Location: [facility];   Service: Ophthalmology;  Laterality: Right;  SF6  . [place]  . MEMBRANE PEEL Right 12/23/2013   Procedure: MEMBRANE PEEL;  Surgeon: Corliss Parish, MD;  Location: [facility];  Service: Ophthalmology;  Laterality: Right;  . PARS PLANA VITRECTOMY Right 10/07/2013   Procedure: PARS PLANA VITRECTOMY WITH 25 GAUGE RIGHT EYE;  Surgeon: Corliss Parish, MD;  Location: [facility];  Service: Ophthalmology;  Laterality: Right;  . PARS PLANA VITRECTOMY Right 12/23/2013   Procedure: PARS PLANA VITRECTOMY WITH 25 GAUGE;  Surgeon: Corliss Parish, MD;  Location: [facility];  Service: Ophthalmology;  Laterality: Right;  . TUBAL LIGATION      OB History    Gravida  0   Para  0   Term  0   Preterm  0   AB  0   Living  0     SAB  0   IAB  0   Ectopic  0   Multiple  0   Live Births  0            Home Medications    Prior to Admission medications   Medication Sig Start Date End Date Taking? Authorizing Provider  Multiple Vitamins-Minerals (MULTIVITAMIN WITH MINERALS) tablet Take 1 tablet by mouth daily.   Yes [provider]  pantoprazole (PROTONIX)  40 MG tablet TAKE 1 TABLET BY MOUTH ONCE DAILY AS NEEDED FOR ACID REFLUX 11/08/18  Yes Hoyt Koch, MD  tobramycin-dexamethasone [facility]) ophthalmic solution Place 1 drop into the right eye every 4 (four) hours while awake for 5 days. 04/25/20 04/30/20 Yes Faustino Congress, NP  fluticasone (FLONASE) 50 MCG/ACT nasal spray Place 2 sprays into both nostrils daily. 11/08/18   Hoyt Koch, MD    Family History Family History  Problem Relation Age of Onset  . Hypertension Mother   . Atrial fibrillation Mother   . Deep vein thrombosis Mother   . Osteoporosis Mother        compression fx  . Thyroid disease Mother   . Osteoarthritis Mother   . Arthritis Mother        shoulder replacement  . Stroke Father   . Anorexia nervosa Sister   . Osteoporosis Sister   . Thyroid  disease Sister   . Diabetes Brother   . Cancer Paternal Grandmother        multi-organ    Social History Social History   Tobacco Use  . Smoking status: Never Smoker  . Smokeless tobacco: Never Used  Vaping Use  . Vaping Use: Never used  Substance Use Topics  . Alcohol use: Yes    Comment: socially  . Drug use: No     Allergies   Latex and Sulfonamide derivatives   Review of Systems Review of Systems   Physical Exam Triage Vital Signs ED Triage Vitals  Enc Vitals Group     BP 04/25/20 0915 (!) 152/89     Pulse Rate 04/25/20 0915 84     Resp 04/25/20 0915 16     Temp 04/25/20 0915 98.4 F (36.9 C)     Temp Source 04/25/20 0915 Oral     SpO2 04/25/20 0915 100 %     Weight --      Height --      Head Circumference --      Peak Flow --      Pain Score 04/25/20 0916 2     Pain Loc --      Pain Edu? --      Excl. in [place]? --    No data found.  Updated Vital Signs BP (!) 152/89 (BP Location: Left Arm)   Pulse 84   Temp 98.4 F (36.9 C) (Oral)   Resp 16   LMP 01/10/2006   SpO2 100%   Visual Acuity Right Eye Distance: 20/50 Left Eye Distance: 20/25 Bilateral Distance: 20/25  Right Eye Near:   Left Eye Near:    Bilateral Near:     Physical Exam Vitals and nursing note reviewed.  Constitutional:      General: She is not in acute distress.    Appearance: She is well-developed.  HENT:     Head: Normocephalic and atraumatic.  Eyes:     General: Lids are everted, no foreign bodies appreciated.        Right eye: Discharge present. No foreign body or hordeolum.        Left eye: No foreign body, discharge or hordeolum.     Extraocular Movements: Extraocular movements intact.     Conjunctiva/sclera:     Right eye: Right conjunctiva is injected. Exudate present. No chemosis or hemorrhage.    Left eye: Left conjunctiva is not injected. No chemosis, exudate or hemorrhage.     Comments: Area of erythema, swelling. Thick green drainage noted to the  eyelashes.  Cardiovascular:     Rate and Rhythm: Normal rate and regular rhythm.     Heart sounds: No murmur heard.   Pulmonary:     Effort: Pulmonary effort is normal. No respiratory distress.     Breath sounds: Normal breath sounds.  Abdominal:     Palpations: Abdomen is soft.     Tenderness: There is no abdominal tenderness.  Musculoskeletal:     Cervical back: Normal range of motion and neck supple.  Skin:    General: Skin is warm and dry.     Capillary Refill: Capillary refill takes less than 2 seconds.  Neurological:     General: No focal deficit present.     Mental Status: She is alert and oriented to person, place, and time.  Psychiatric:        Mood and Affect: Mood normal.        Behavior: Behavior normal.        Thought Content: Thought content normal.      UC Treatments / Results  Labs (all labs ordered are listed, but only abnormal results are displayed) Labs Reviewed - No data to display  EKG   Radiology No results found.  Procedures Procedures (including critical care time)  Medications Ordered in UC Medications  dexamethasone (DECADRON) injection 10 mg (10 mg Intramuscular Given 04/25/20 0942)    Initial Impression / Assessment and Plan / UC Course  I have reviewed the triage vital signs and the nursing notes.  Pertinent labs & imaging results that were available during my care of the patient were reviewed by me and considered in my medical decision making (see chart for details).    Acute conjunctivitis Blepharitis of R upper and lower lid Swelling of R eyelid  Eye wash performed in office Decadron 10mg  IM in office to decrease swelling Prescribed tobradex drops, one drop to the R eye every 4 hours x 5 days Follow up with ophthalmology if symptoms persist  Final Clinical Impressions(s) / UC Diagnoses   Final diagnoses:  Blepharitis of both upper and lower eyelid of right eye, unspecified type  Swelling of right eyelid  Acute bacterial  conjunctivitis of right eye     Discharge Instructions     You have received a steroid injection in the office today to help with the swelling around your right eye  I have sent in tobradex drops for you to use one drop every 4 hours to the right eye. May use 2 drops for the first 2 doses.  Follow up with this office or with primary care if symptoms are persisting.  Follow up in the ER for high fever, trouble swallowing, trouble breathing, other concerning symptoms.     ED Prescriptions    Medication Sig Dispense Auth. Provider   tobramycin-dexamethasone [facility]) ophthalmic solution Place 1 drop into the right eye every 4 (four) hours while awake for 5 days. 5 mL Faustino Congress, NP     PDMP not reviewed this encounter.   Faustino Congress, NP 04/25/20 [phone]

## 2020-05-11 ENCOUNTER — Other Ambulatory Visit (HOSPITAL_COMMUNITY): Payer: Self-pay

## 2020-05-11 ENCOUNTER — Ambulatory Visit (INDEPENDENT_AMBULATORY_CARE_PROVIDER_SITE_OTHER): Payer: 59 | Admitting: Internal Medicine

## 2020-05-11 ENCOUNTER — Encounter: Payer: Self-pay | Admitting: Internal Medicine

## 2020-05-11 ENCOUNTER — Other Ambulatory Visit: Payer: Self-pay

## 2020-05-11 VITALS — BP 118/82 | HR 58 | Temp 98.6°F | Resp 18 | Ht 63.75 in | Wt 138.6 lb

## 2020-05-11 DIAGNOSIS — K219 Gastro-esophageal reflux disease without esophagitis: Secondary | ICD-10-CM

## 2020-05-11 DIAGNOSIS — E559 Vitamin D deficiency, unspecified: Secondary | ICD-10-CM

## 2020-05-11 DIAGNOSIS — Z23 Encounter for immunization: Secondary | ICD-10-CM | POA: Diagnosis not present

## 2020-05-11 DIAGNOSIS — Z0000 Encounter for general adult medical examination without abnormal findings: Secondary | ICD-10-CM | POA: Diagnosis not present

## 2020-05-11 DIAGNOSIS — M810 Age-related osteoporosis without current pathological fracture: Secondary | ICD-10-CM

## 2020-05-11 MED ORDER — PANTOPRAZOLE SODIUM 40 MG PO TBEC
DELAYED_RELEASE_TABLET | ORAL | 3 refills | Status: DC
  Filled 2020-05-11: qty 90, 90d supply, fill #0
  Filled 2020-08-11 (×2): qty 90, 90d supply, fill #1
  Filled 2020-11-14: qty 90, 90d supply, fill #2
  Filled 2021-02-11: qty 90, 90d supply, fill #3

## 2020-05-11 NOTE — Assessment & Plan Note (Signed)
Refill protonix. Is having more symptoms as she has been without medication for some time.

## 2020-05-11 NOTE — Assessment & Plan Note (Signed)
Counseled FRAX risk major 15% and hip 3.9% and does meet treatment threshold. She declines today. Counseled about weight bearing activity and calcium and vitamin D intake. Dexa due 2023.

## 2020-05-11 NOTE — Assessment & Plan Note (Signed)
Advised 2000 units vitamin D daily.

## 2020-05-11 NOTE — Progress Notes (Signed)
   Subjective:   Patient ID: [name], female    DOB: 1955-08-12, 65 y.o.   MRN: [id number]  HPI The patient is a 65 YO female coming in for physical.   PMH, [place], social history reviewed and updated  Review of Systems  Constitutional: Negative.   HENT: Negative.   Eyes: Negative.   Respiratory: Negative for cough, chest tightness and shortness of breath.   Cardiovascular: Negative for chest pain, palpitations and leg swelling.  Gastrointestinal: Negative for abdominal distention, abdominal pain, constipation, diarrhea, nausea and vomiting.  Musculoskeletal: Negative.   Skin: Negative.   Neurological: Negative.   Psychiatric/Behavioral: Negative.     Objective:  Physical Exam Constitutional:      Appearance: She is well-developed.  HENT:     Head: Normocephalic and atraumatic.  Cardiovascular:     Rate and Rhythm: Normal rate and regular rhythm.  Pulmonary:     Effort: Pulmonary effort is normal. No respiratory distress.     Breath sounds: Normal breath sounds. No wheezing or rales.  Abdominal:     General: Bowel sounds are normal. There is no distension.     Palpations: Abdomen is soft.     Tenderness: There is no abdominal tenderness. There is no rebound.  Musculoskeletal:     Cervical back: Normal range of motion.  Skin:    General: Skin is warm and dry.  Neurological:     Mental Status: She is alert and oriented to person, place, and time.     Coordination: Coordination normal.    Vitals:   05/11/20 0751  BP: 118/82  Pulse: (!) 58  Resp: 18  Temp: 98.6 F (37 C)  TempSrc: Oral  Weight: 138 lb 9.6 oz (62.9 kg)  Height: 5' 3.75" (1.619 m)   This visit occurred during the SARS-CoV-2 public health emergency.  Safety protocols were in place, including screening questions prior to the visit, additional usage of staff PPE, and extensive cleaning of exam room while observing appropriate contact time as indicated for disinfecting solutions.   Assessment & Plan:   Pneumonia 23 given at visit

## 2020-05-11 NOTE — Patient Instructions (Addendum)
2000 units vitamin D daily. 500 mg twice a day for calcium.  Health Maintenance, Female Adopting a healthy lifestyle and getting preventive care are important in promoting health and wellness. Ask your health care provider about:  The right schedule for you to have regular tests and exams.  Things you can do on your own to prevent diseases and keep yourself healthy. What should I know about diet, weight, and exercise? Eat a healthy diet  Eat a diet that includes plenty of vegetables, fruits, low-fat dairy products, and lean protein.  Do not eat a lot of foods that are high in solid fats, added sugars, or sodium.   Maintain a healthy weight Body mass index (BMI) is used to identify weight problems. It estimates body fat based on height and weight. Your health care provider can help determine your BMI and help you achieve or maintain a healthy weight. Get regular exercise Get regular exercise. This is one of the most important things you can do for your health. Most adults should:  Exercise for at least 150 minutes each week. The exercise should increase your heart rate and make you sweat (moderate-intensity exercise).  Do strengthening exercises at least twice a week. This is in addition to the moderate-intensity exercise.  Spend less time sitting. Even light physical activity can be beneficial. Watch cholesterol and blood lipids Have your blood tested for lipids and cholesterol at 66 years of age, then have this test every 5 years. Have your cholesterol levels checked more often if:  Your lipid or cholesterol levels are high.  You are older than 66 years of age.  You are at high risk for heart disease. What should I know about cancer screening? Depending on your health history and family history, you may need to have cancer screening at various ages. This may include screening for:  Breast cancer.  Cervical cancer.  Colorectal cancer.  Skin cancer.  Lung cancer. What  should I know about heart disease, diabetes, and high blood pressure? Blood pressure and heart disease  High blood pressure causes heart disease and increases the risk of stroke. This is more likely to develop in people who have high blood pressure readings, are of African descent, or are overweight.  Have your blood pressure checked: ? Every 3-5 years if you are 67-90 years of age. ? Every year if you are 65 years old or older. Diabetes Have regular diabetes screenings. This checks your fasting blood sugar level. Have the screening done:  Once every three years after age 94 if you are at a normal weight and have a low risk for diabetes.  More often and at a younger age if you are overweight or have a high risk for diabetes. What should I know about preventing infection? Hepatitis B If you have a higher risk for hepatitis B, you should be screened for this virus. Talk with your health care provider to find out if you are at risk for hepatitis B infection. Hepatitis C Testing is recommended for:  Everyone born from [birth year] through 1966.  Anyone with known risk factors for hepatitis C. Sexually transmitted infections (STIs)  Get screened for STIs, including gonorrhea and chlamydia, if: ? You are sexually active and are younger than 66 years of age. ? You are older than 66 years of age and your health care provider tells you that you are at risk for this type of infection. ? Your sexual activity has changed since you were last screened, and you  are at increased risk for chlamydia or gonorrhea. Ask your health care provider if you are at risk.  Ask your health care provider about whether you are at high risk for HIV. Your health care provider may recommend a prescription medicine to help prevent HIV infection. If you choose to take medicine to prevent HIV, you should first get tested for HIV. You should then be tested every 3 months for as long as you are taking the medicine. Pregnancy  If  you are about to stop having your period (premenopausal) and you may become pregnant, seek counseling before you get pregnant.  Take 400 to 800 micrograms (mcg) of folic acid every day if you become pregnant.  Ask for birth control (contraception) if you want to prevent pregnancy. Osteoporosis and menopause Osteoporosis is a disease in which the bones lose minerals and strength with aging. This can result in bone fractures. If you are 62 years old or older, or if you are at risk for osteoporosis and fractures, ask your health care provider if you should:  Be screened for bone loss.  Take a calcium or vitamin D supplement to lower your risk of fractures.  Be given hormone replacement therapy (HRT) to treat symptoms of menopause. Follow these instructions at home: Lifestyle  Do not use any products that contain nicotine or tobacco, such as cigarettes, e-cigarettes, and chewing tobacco. If you need help quitting, ask your health care provider.  Do not use street drugs.  Do not share needles.  Ask your health care provider for help if you need support or information about quitting drugs. Alcohol use  Do not drink alcohol if: ? Your health care provider tells you not to drink. ? You are pregnant, may be pregnant, or are planning to become pregnant.  If you drink alcohol: ? Limit how much you use to 0-1 drink a day. ? Limit intake if you are breastfeeding.  Be aware of how much alcohol is in your drink. In the [place] one drink equals one 12 oz bottle of beer (355 mL), one 5 oz glass of wine (148 mL), or one 1 oz glass of hard liquor (44 mL). General instructions  Schedule regular health, dental, and eye exams.  Stay current with your vaccines.  Tell your health care provider if: ? You often feel depressed. ? You have ever been abused or do not feel safe at home. Summary  Adopting a healthy lifestyle and getting preventive care are important in promoting health and  wellness.  Follow your health care provider's instructions about healthy diet, exercising, and getting tested or screened for diseases.  Follow your health care provider's instructions on monitoring your cholesterol and blood pressure. This information is not intended to replace advice given to you by your health care provider. Make sure you discuss any questions you have with your health care provider. Document Revised: 12/20/2017 Document Reviewed: 12/20/2017 Elsevier Patient Education  2021 [employer].

## 2020-05-11 NOTE — Assessment & Plan Note (Signed)
Flu shot up to date. Covid-19 3 shots will provide records. Pneumonia 23 given today. Shingrix complete. Tetanus up to date. Colonoscopy up to date. Mammogram up to date, pap smear up to date and dexa due 2023. Counseled about sun safety and mole surveillance. Counseled about the dangers of distracted driving. Given 10 year screening recommendations.

## 2020-05-26 ENCOUNTER — Encounter: Payer: Self-pay | Admitting: Internal Medicine

## 2020-05-26 DIAGNOSIS — L719 Rosacea, unspecified: Secondary | ICD-10-CM

## 2020-06-17 ENCOUNTER — Telehealth: Payer: Self-pay | Admitting: Dermatology

## 2020-06-17 NOTE — Telephone Encounter (Signed)
Notes documented and referral routed back to referring office. 

## 2020-06-17 NOTE — Telephone Encounter (Signed)
[facility] referral doesn't want to wait until December; please ask them to try to get her in elsewhere sooner.

## 2020-07-24 ENCOUNTER — Encounter: Payer: Self-pay | Admitting: Internal Medicine

## 2020-08-03 ENCOUNTER — Other Ambulatory Visit: Payer: 59

## 2020-08-11 ENCOUNTER — Other Ambulatory Visit (HOSPITAL_COMMUNITY): Payer: Self-pay

## 2020-10-23 ENCOUNTER — Ambulatory Visit: Payer: 59 | Attending: Internal Medicine

## 2020-10-23 DIAGNOSIS — Z23 Encounter for immunization: Secondary | ICD-10-CM

## 2020-10-23 NOTE — Progress Notes (Signed)
   Covid-19 Vaccination Clinic  Name:  TRICHELLE LEHAN    MRN: [id number] DOB: 1955-08-30  10/23/2020  Ms. Sawin was observed post Covid-19 immunization for 15 minutes without incident. She was provided with Vaccine Information Sheet and instruction to access the V-Safe system.   Ms. Dezeeuw was instructed to call 911 with any severe reactions post vaccine: Difficulty breathing  Swelling of face and throat  A fast heartbeat  A bad rash all over body  Dizziness and weakness

## 2020-11-13 ENCOUNTER — Other Ambulatory Visit (HOSPITAL_BASED_OUTPATIENT_CLINIC_OR_DEPARTMENT_OTHER): Payer: Self-pay

## 2020-11-13 MED ORDER — PFIZER COVID-19 VAC BIVALENT 30 MCG/0.3ML IM SUSP
INTRAMUSCULAR | 0 refills | Status: DC
  Filled 2020-11-13: qty 0.3, 1d supply, fill #0

## 2020-11-14 ENCOUNTER — Other Ambulatory Visit (HOSPITAL_COMMUNITY): Payer: Self-pay

## 2020-11-23 NOTE — Progress Notes (Signed)
65 y.o. G0P0000 Single White or Caucasian Not Hispanic or Latino female here for annual exam.  No vaginal bleeding, no bowel or bladder c/o. Lives with her long term partner, not sexually active secondary to ED.  H/O osteoporosis, declines treatment. On calcium and vit D.     Patient's last menstrual period was 01/10/2006.          Sexually active: No.  The current method of family planning is post menopausal status.    Exercising: No.  The patient does not participate in regular exercise at present. Smoker:  no  Health Maintenance: Pap:  10-19-15 Neg:Neg HR HPV, 06-11-11 Neg History of abnormal Pap:  no MMG:  03/13/20 density B Bi-rads 1 neg  BMD:   11/22/19 osteoporotic, T score -2.8  Colonoscopy: 05/10/16 polyps f/u 5 years  TDaP:  03/20/17  Gardasil: n/a    reports that she has never smoked. She has never used smokeless tobacco. She reports current alcohol use. She reports that she does not use drugs. 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 She helps takes care of a disabled brother, lives on his own.  Past Medical History:  Diagnosis Date   GERD (gastroesophageal reflux disease)    takes Omeprazole and Protonix daily   H/O hiatal hernia    History of bronchitis    15+yrs ago   History of colon polyps    History of vertigo    takes Antivert daily as needed   Joint pain    Meniere's disease    L  hearing loss related to same   Osteoporosis    PMB (postmenopausal bleeding) 12/07   proliferative endo. No HRT, PUS normal    Past Surgical History:  Procedure Laterality Date   ABSCESS DRAINAGE  09/2010   suprapubic   BTSP  1994   CATARACT EXTRACTION EXTRACAPSULAR Right 10/2014   COLONOSCOPY W/ BIOPSIES  12/12/05   hyperplastic polyp, recheck in 10 years   CYSTECTOMY     from buttock   GAS/FLUID EXCHANGE Right 12/23/2013   Procedure: GAS/FLUID EXCHANGE;  Surgeon: Corliss Parish, MD;  Location: [place];  Service: Ophthalmology;  Laterality: Right;  SF6    IRRIGATION AND [place]   MEMBRANE PEEL Right 12/23/2013   Procedure: MEMBRANE PEEL;  Surgeon: Corliss Parish, MD;  Location: [place];  Service: Ophthalmology;  Laterality: Right;   PARS PLANA VITRECTOMY Right 10/07/2013   Procedure: PARS PLANA VITRECTOMY WITH 25 GAUGE RIGHT EYE;  Surgeon: Corliss Parish, MD;  Location: [place];  Service: Ophthalmology;  Laterality: Right;   PARS PLANA VITRECTOMY Right 12/23/2013   Procedure: PARS PLANA VITRECTOMY WITH 25 GAUGE;  Surgeon: Corliss Parish, MD;  Location: [place];  Service: Ophthalmology;  Laterality: Right;   TUBAL LIGATION      Current Outpatient Medications  Medication Sig Dispense Refill   fluticasone (FLONASE) 50 MCG/ACT nasal spray Place 2 sprays into both nostrils daily. 16 g 6   Multiple Vitamins-Minerals (MULTIVITAMIN WITH MINERALS) tablet Take 1 tablet by mouth daily.     pantoprazole (PROTONIX) 40 MG tablet TAKE 1 TABLET BY MOUTH ONCE DAILY AS NEEDED FOR ACID REFLUX 90 tablet 3   No current facility-administered medications for this visit.    Family History  Problem Relation Age of Onset   Hypertension Mother    Atrial fibrillation Mother    Deep vein thrombosis Mother    Osteoporosis Mother  compression fx   Thyroid disease Mother    Osteoarthritis Mother    Arthritis Mother        shoulder replacement   Stroke Father    Anorexia nervosa Sister    Osteoporosis Sister    Thyroid disease Sister    Diabetes Brother    Cancer Paternal Grandmother        multi-organ    Review of Systems  All other systems reviewed and are negative.  Exam:   BP 130/68   Pulse 73   Ht 5\' 3"  (1.6 m)   Wt 139 lb (63 kg)   LMP 01/10/2006   SpO2 99%   BMI 24.62 kg/m   Weight change: @WEIGHTCHANGE @ Height:   Height: 5\' 3"  (160 cm)  Ht Readings from Last 3 Encounters:  11/26/20 5\' 3"  (1.6 m)  05/11/20 5' 3.75" (1.619 m)  11/14/19 5' 3.75" (1.619 m)    General appearance: alert, cooperative and  appears stated age Head: Normocephalic, without obvious abnormality, atraumatic Neck: no adenopathy, supple, symmetrical, trachea midline and thyroid normal to inspection and palpation Lungs: clear to auscultation bilaterally Cardiovascular: regular rate and rhythm Breasts: normal appearance, no masses or tenderness Abdomen: soft, non-tender; non distended,  no masses,  no organomegaly Extremities: extremities normal, atraumatic, no cyanosis or edema Skin: Skin color, texture, turgor normal. No rashes or lesions Lymph nodes: Cervical, supraclavicular, and axillary nodes normal. No abnormal inguinal nodes palpated Neurologic: Grossly normal   Pelvic: External genitalia:  no lesions              Urethra:  normal appearing urethra with no masses, tenderness or lesions              Bartholins and Skenes: normal                 Vagina: normal appearing vagina with normal color and discharge, no lesions              Cervix: no lesions               Bimanual Exam:  Uterus:   no masses or tenderness              Adnexa: no mass, fullness, tenderness               Rectovaginal: Confirms               Anus:  normal sphincter tone, no lesions  Gae Dry chaperoned for the exam.  1. Well woman exam Discussed breast self exam Colonoscopy due 5/23 with Dr Collene Mares Mammogram in 3/23  2. Osteoporosis, unspecified osteoporosis type, unspecified pathological fracture presence Discussed calcium and vit D intake DEXA in 11/23  3. Vitamin D deficiency She is on a multivit with vit d, not sure of the vit d dose - VITAMIN D 25 Hydroxy (Vit-D Deficiency, Fractures)  4. Laboratory exam ordered as part of routine general medical examination - CBC - Comprehensive metabolic panel - Lipid panel  5. Screening for cervical cancer - Cytology - PAP

## 2020-11-26 ENCOUNTER — Encounter: Payer: Self-pay | Admitting: Obstetrics and Gynecology

## 2020-11-26 ENCOUNTER — Other Ambulatory Visit: Payer: Self-pay

## 2020-11-26 ENCOUNTER — Other Ambulatory Visit (HOSPITAL_COMMUNITY)
Admission: RE | Admit: 2020-11-26 | Discharge: 2020-11-26 | Disposition: A | Discharge status: Home / Self Care | Payer: 59 | Source: Ambulatory Visit | Attending: Obstetrics and Gynecology | Admitting: Obstetrics and Gynecology

## 2020-11-26 ENCOUNTER — Ambulatory Visit (INDEPENDENT_AMBULATORY_CARE_PROVIDER_SITE_OTHER): Payer: 59 | Admitting: Obstetrics and Gynecology

## 2020-11-26 VITALS — BP 130/68 | HR 73 | Ht 63.0 in | Wt 139.0 lb

## 2020-11-26 DIAGNOSIS — M810 Age-related osteoporosis without current pathological fracture: Secondary | ICD-10-CM

## 2020-11-26 DIAGNOSIS — E559 Vitamin D deficiency, unspecified: Secondary | ICD-10-CM | POA: Diagnosis not present

## 2020-11-26 DIAGNOSIS — Z01419 Encounter for gynecological examination (general) (routine) without abnormal findings: Secondary | ICD-10-CM | POA: Diagnosis not present

## 2020-11-26 DIAGNOSIS — Z124 Encounter for screening for malignant neoplasm of cervix: Secondary | ICD-10-CM | POA: Diagnosis not present

## 2020-11-26 DIAGNOSIS — Z0000 Encounter for general adult medical examination without abnormal findings: Secondary | ICD-10-CM

## 2020-11-26 NOTE — Patient Instructions (Signed)

## 2020-11-27 LAB — COMPREHENSIVE METABOLIC PANEL
AG Ratio: 1.7 (calc) (ref 1.0–2.5)
ALT: 19 U/L (ref 6–29)
AST: 20 U/L (ref 10–35)
Albumin: 4.4 g/dL (ref 3.6–5.1)
Alkaline phosphatase (APISO): 46 U/L (ref 37–153)
BUN: 17 mg/dL (ref 7–25)
CO2: 29 mmol/L (ref 20–32)
Calcium: 9 mg/dL (ref 8.6–10.4)
Chloride: 102 mmol/L (ref 98–110)
Creat: 0.82 mg/dL (ref 0.50–1.05)
Globulin: 2.6 g/dL (calc) (ref 1.9–3.7)
Glucose, Bld: 92 mg/dL (ref 65–99)
Potassium: 4 mmol/L (ref 3.5–5.3)
Sodium: 140 mmol/L (ref 135–146)
Total Bilirubin: 0.4 mg/dL (ref 0.2–1.2)
Total Protein: 7 g/dL (ref 6.1–8.1)

## 2020-11-27 LAB — CBC
HCT: 41.8 % (ref 35.0–45.0)
Hemoglobin: 13.9 g/dL (ref 11.7–15.5)
MCH: 31 pg (ref 27.0–33.0)
MCHC: 33.3 g/dL (ref 32.0–36.0)
MCV: 93.3 fL (ref 80.0–100.0)
MPV: 11.8 fL (ref 7.5–12.5)
Platelets: 225 10*3/uL (ref 140–400)
RBC: 4.48 10*6/uL (ref 3.80–5.10)
RDW: 11.9 % (ref 11.0–15.0)
WBC: 8.3 10*3/uL (ref 3.8–10.8)

## 2020-11-27 LAB — LIPID PANEL
Cholesterol: 212 mg/dL — ABNORMAL HIGH (ref <200)
HDL: 73 mg/dL (ref >=50)
LDL Cholesterol (Calc): 112 mg/dL (calc) — ABNORMAL HIGH
Non-HDL Cholesterol (Calc): 139 mg/dL (calc) — ABNORMAL HIGH (ref <130)
Total CHOL/HDL Ratio: 2.9 (calc) (ref <5.0)
Triglycerides: 159 mg/dL — ABNORMAL HIGH (ref <150)

## 2020-11-27 LAB — VITAMIN D 25 HYDROXY (VIT D DEFICIENCY, FRACTURES): Vit D, 25-Hydroxy: 36 ng/mL (ref 30–100)

## 2020-11-30 LAB — CYTOLOGY - PAP
Comment: NEGATIVE
Diagnosis: NEGATIVE
High risk HPV: NEGATIVE

## 2021-02-11 ENCOUNTER — Other Ambulatory Visit (HOSPITAL_COMMUNITY): Payer: Self-pay

## 2021-02-24 ENCOUNTER — Encounter: Payer: Self-pay | Admitting: Internal Medicine

## 2021-04-20 ENCOUNTER — Other Ambulatory Visit: Payer: Self-pay | Admitting: Obstetrics and Gynecology

## 2021-04-20 DIAGNOSIS — Z1231 Encounter for screening mammogram for malignant neoplasm of breast: Secondary | ICD-10-CM

## 2021-04-28 DIAGNOSIS — H35372 Puckering of macula, left eye: Secondary | ICD-10-CM | POA: Diagnosis not present

## 2021-04-28 DIAGNOSIS — H524 Presbyopia: Secondary | ICD-10-CM | POA: Diagnosis not present

## 2021-04-28 DIAGNOSIS — H52203 Unspecified astigmatism, bilateral: Secondary | ICD-10-CM | POA: Diagnosis not present

## 2021-05-18 DIAGNOSIS — Z86010 Personal history of colonic polyps: Secondary | ICD-10-CM | POA: Diagnosis not present

## 2021-05-18 DIAGNOSIS — Z1211 Encounter for screening for malignant neoplasm of colon: Secondary | ICD-10-CM | POA: Diagnosis not present

## 2021-05-18 DIAGNOSIS — Z8371 Family history of colonic polyps: Secondary | ICD-10-CM | POA: Diagnosis not present

## 2021-05-18 DIAGNOSIS — K5730 Diverticulosis of large intestine without perforation or abscess without bleeding: Secondary | ICD-10-CM | POA: Diagnosis not present

## 2021-05-18 DIAGNOSIS — K219 Gastro-esophageal reflux disease without esophagitis: Secondary | ICD-10-CM | POA: Diagnosis not present

## 2021-05-19 ENCOUNTER — Other Ambulatory Visit: Payer: Self-pay | Admitting: Internal Medicine

## 2021-05-19 ENCOUNTER — Encounter: Payer: 59 | Admitting: Internal Medicine

## 2021-05-19 ENCOUNTER — Other Ambulatory Visit (HOSPITAL_COMMUNITY): Payer: Self-pay

## 2021-05-19 MED ORDER — PANTOPRAZOLE SODIUM 40 MG PO TBEC
DELAYED_RELEASE_TABLET | ORAL | 0 refills | Status: DC
  Filled 2021-05-19: qty 30, 30d supply, fill #0

## 2021-05-25 ENCOUNTER — Encounter: Payer: Self-pay | Admitting: Internal Medicine

## 2021-05-25 ENCOUNTER — Other Ambulatory Visit (HOSPITAL_COMMUNITY): Payer: Self-pay

## 2021-05-25 ENCOUNTER — Ambulatory Visit (INDEPENDENT_AMBULATORY_CARE_PROVIDER_SITE_OTHER): Payer: 59 | Admitting: Internal Medicine

## 2021-05-25 VITALS — BP 114/80 | HR 57 | Resp 18 | Ht 63.0 in | Wt 138.2 lb

## 2021-05-25 DIAGNOSIS — M810 Age-related osteoporosis without current pathological fracture: Secondary | ICD-10-CM | POA: Diagnosis not present

## 2021-05-25 DIAGNOSIS — E559 Vitamin D deficiency, unspecified: Secondary | ICD-10-CM

## 2021-05-25 DIAGNOSIS — Z23 Encounter for immunization: Secondary | ICD-10-CM | POA: Diagnosis not present

## 2021-05-25 DIAGNOSIS — Z0000 Encounter for general adult medical examination without abnormal findings: Secondary | ICD-10-CM | POA: Diagnosis not present

## 2021-05-25 DIAGNOSIS — K219 Gastro-esophageal reflux disease without esophagitis: Secondary | ICD-10-CM

## 2021-05-25 LAB — COMPREHENSIVE METABOLIC PANEL
ALT: 19 U/L (ref 0–35)
AST: 19 U/L (ref 0–37)
Albumin: 4.4 g/dL (ref 3.5–5.2)
Alkaline Phosphatase: 41 U/L (ref 39–117)
BUN: 18 mg/dL (ref 6–23)
CO2: 27 mEq/L (ref 19–32)
Calcium: 9.1 mg/dL (ref 8.4–10.5)
Chloride: 103 mEq/L (ref 96–112)
Creatinine, Ser: 0.86 mg/dL (ref 0.40–1.20)
GFR: 70.29 mL/min (ref >60.00)
Glucose, Bld: 100 mg/dL — ABNORMAL HIGH (ref 70–99)
Potassium: 3.9 mEq/L (ref 3.5–5.1)
Sodium: 138 mEq/L (ref 135–145)
Total Bilirubin: 0.7 mg/dL (ref 0.2–1.2)
Total Protein: 7.2 g/dL (ref 6.0–8.3)

## 2021-05-25 LAB — LIPID PANEL
Cholesterol: 222 mg/dL — ABNORMAL HIGH (ref 0–200)
HDL: 81.4 mg/dL (ref >39.00)
LDL Cholesterol: 114 mg/dL — ABNORMAL HIGH (ref 0–99)
NonHDL: 140.94
Total CHOL/HDL Ratio: 3
Triglycerides: 133 mg/dL (ref 0.0–149.0)
VLDL: 26.6 mg/dL (ref 0.0–40.0)

## 2021-05-25 LAB — CBC
HCT: 39.1 % (ref 36.0–46.0)
Hemoglobin: 13.3 g/dL (ref 12.0–15.0)
MCHC: 34.1 g/dL (ref 30.0–36.0)
MCV: 92.9 fl (ref 78.0–100.0)
Platelets: 207 10*3/uL (ref 150.0–400.0)
RBC: 4.21 Mil/uL (ref 3.87–5.11)
RDW: 13.9 % (ref 11.5–15.5)
WBC: 6.9 10*3/uL (ref 4.0–10.5)

## 2021-05-25 LAB — VITAMIN D 25 HYDROXY (VIT D DEFICIENCY, FRACTURES): VITD: 28.86 ng/mL — ABNORMAL LOW (ref 30.00–100.00)

## 2021-05-25 MED ORDER — PANTOPRAZOLE SODIUM 40 MG PO TBEC
DELAYED_RELEASE_TABLET | ORAL | 3 refills | Status: DC
  Filled 2021-05-25 – 2021-06-19 (×2): qty 90, 90d supply, fill #0
  Filled 2021-09-06: qty 90, 90d supply, fill #1
  Filled 2021-12-15: qty 90, 90d supply, fill #2
  Filled 2022-03-31: qty 90, 90d supply, fill #3

## 2021-05-25 NOTE — Assessment & Plan Note (Signed)
Checking vitamin D level and adjust as needed.  

## 2021-05-25 NOTE — Assessment & Plan Note (Signed)
Flu shot yearly. Covid-19 up to date. Pneumonia 20 given at visit. Shingrix complete. Tetanus up to date. Colonoscopy getting July 2023. Mammogram scheduled for June, pap smear aged out and dexa ordered for fall 2023. Counseled about sun safety and mole surveillance. Counseled about the dangers of distracted driving. Given 10 year screening recommendations.  ? ?

## 2021-05-25 NOTE — Assessment & Plan Note (Signed)
Ordered bone density for this fall as follow up. Not currently on treatment and she has declined this in the past but may change her mind if bones are changing. Checking CMP and vitamin D in case treatment needed. ?

## 2021-05-25 NOTE — Progress Notes (Signed)
? ?  Subjective:  ? ?Patient ID: [name], female    DOB: [birth year], 66 y.o.   MRN: [id number] ? ?HPI ?The patient is here for physical. ? ?PMH, [facility], social history reviewed and updated ? ?Review of Systems  ?Constitutional: Negative.   ?HENT: Negative.    ?Eyes: Negative.   ?Respiratory:  Negative for cough, chest tightness and shortness of breath.   ?Cardiovascular:  Negative for chest pain, palpitations and leg swelling.  ?Gastrointestinal:  Negative for abdominal distention, abdominal pain, constipation, diarrhea, nausea and vomiting.  ?Musculoskeletal: Negative.   ?Skin: Negative.   ?Neurological: Negative.   ?Psychiatric/Behavioral: Negative.    ? ?Objective:  ?Physical Exam ?Constitutional:   ?   Appearance: She is well-developed.  ?HENT:  ?   Head: Normocephalic and atraumatic.  ?Cardiovascular:  ?   Rate and Rhythm: Normal rate and regular rhythm.  ?Pulmonary:  ?   Effort: Pulmonary effort is normal. No respiratory distress.  ?   Breath sounds: Normal breath sounds. No wheezing or rales.  ?Abdominal:  ?   General: Bowel sounds are normal. There is no distension.  ?   Palpations: Abdomen is soft.  ?   Tenderness: There is no abdominal tenderness. There is no rebound.  ?Musculoskeletal:  ?   Cervical back: Normal range of motion.  ?Skin: ?   General: Skin is warm and dry.  ?Neurological:  ?   Mental Status: She is alert and oriented to person, place, and time.  ?   Coordination: Coordination normal.  ? ? ?Vitals:  ? 05/25/21 1100  ?BP: 114/80  ?Pulse: (!) 57  ?Resp: 18  ?SpO2: 99%  ?Weight: 138 lb 3.2 oz (62.7 kg)  ?Height: '5\' 3"'$  (1.6 m)  ? ?EKG: Rate 66, axis normal, interval normal, sinus, no st or t wave changes, no significant change compared to prior 2017 ? ?This visit occurred during the SARS-CoV-2 public health emergency.  Safety protocols were in place, including screening questions prior to the visit, additional usage of staff PPE, and extensive cleaning of exam room while observing appropriate  contact time as indicated for disinfecting solutions.  ? ?Assessment & Plan:  ?Prevnar 20 given at visit ?

## 2021-05-25 NOTE — Assessment & Plan Note (Signed)
Controlled on protonix 40 mg daily and tried to go off recently without success. Refilled. ?

## 2021-05-25 NOTE — Patient Instructions (Signed)
We have given you the pneumonia shot today. We will get the bone density test scheduled in November or later.  ? ?The EKG today looks normal and not changed since 2017. ?

## 2021-06-01 ENCOUNTER — Other Ambulatory Visit (HOSPITAL_COMMUNITY): Payer: Self-pay

## 2021-06-01 DIAGNOSIS — L718 Other rosacea: Secondary | ICD-10-CM | POA: Diagnosis not present

## 2021-06-01 MED ORDER — METRONIDAZOLE 0.75 % EX GEL
CUTANEOUS | 4 refills | Status: AC
  Filled 2021-06-01: qty 45, 30d supply, fill #0
  Filled 2021-08-04: qty 45, 30d supply, fill #1
  Filled 2021-12-15: qty 45, 30d supply, fill #2

## 2021-06-01 MED ORDER — AMPICILLIN 500 MG PO CAPS
500.0000 mg | ORAL_CAPSULE | Freq: Two times a day (BID) | ORAL | 0 refills | Status: DC
  Filled 2021-06-01: qty 60, 30d supply, fill #0

## 2021-06-19 ENCOUNTER — Other Ambulatory Visit (HOSPITAL_COMMUNITY): Payer: Self-pay

## 2021-06-21 ENCOUNTER — Ambulatory Visit
Admission: RE | Admit: 2021-06-21 | Discharge: 2021-06-21 | Disposition: A | Discharge status: Home / Self Care | Payer: 59 | Source: Ambulatory Visit | Attending: Obstetrics and Gynecology | Admitting: Obstetrics and Gynecology

## 2021-06-21 DIAGNOSIS — Z1231 Encounter for screening mammogram for malignant neoplasm of breast: Secondary | ICD-10-CM | POA: Diagnosis not present

## 2021-08-04 ENCOUNTER — Other Ambulatory Visit (HOSPITAL_COMMUNITY): Payer: Self-pay

## 2021-08-04 MED ORDER — CLENPIQ 10-3.5-12 MG-GM -GM/175ML PO SOLN
ORAL | 0 refills | Status: DC
  Filled 2021-08-04: qty 350, 1d supply, fill #0

## 2021-08-06 ENCOUNTER — Other Ambulatory Visit (HOSPITAL_COMMUNITY): Payer: Self-pay

## 2021-08-18 DIAGNOSIS — Z1211 Encounter for screening for malignant neoplasm of colon: Secondary | ICD-10-CM | POA: Diagnosis not present

## 2021-08-18 DIAGNOSIS — K5730 Diverticulosis of large intestine without perforation or abscess without bleeding: Secondary | ICD-10-CM | POA: Diagnosis not present

## 2021-08-18 LAB — HM COLONOSCOPY

## 2021-08-31 ENCOUNTER — Encounter: Payer: Self-pay | Admitting: Internal Medicine

## 2021-09-06 ENCOUNTER — Encounter: Payer: Self-pay | Admitting: Internal Medicine

## 2021-09-06 ENCOUNTER — Other Ambulatory Visit (HOSPITAL_COMMUNITY): Payer: Self-pay

## 2021-09-07 ENCOUNTER — Other Ambulatory Visit (HOSPITAL_COMMUNITY): Payer: Self-pay

## 2021-09-07 MED ORDER — MECLIZINE HCL 12.5 MG PO TABS
12.5000 mg | ORAL_TABLET | Freq: Three times a day (TID) | ORAL | 0 refills | Status: DC | PRN
  Filled 2021-09-07: qty 30, 10d supply, fill #0

## 2021-09-07 NOTE — Telephone Encounter (Signed)
Pt is having occasional instances of vertigo, with a recent bad occurrence and would like a new rx sent of Meclizine. I do not see this medication in her med list.

## 2021-11-23 NOTE — Progress Notes (Deleted)
66 y.o. G0P0000 Single White or Caucasian Not Hispanic or Latino female here for annual exam.      Patient's last menstrual period was 01/10/2006.          Sexually active: {yes no:314532}  The current method of family planning is {contraception:315051}.    Exercising: {yes no:314532}  {types:19826} Smoker:  {YES NO:22349}  Health Maintenance: Pap: 10-19-15 Neg:Neg HR HPV, 06-11-11 Neg History of abnormal Pap:  no MMG:  06/30/21 density B Bi-rads 1 neg  BMD:   11/22/19 osteoporotic  Colonoscopy:05/10/16 polyps f/u 5 years  TDaP:  03/20/17  Gardasil: n/a    reports that she has never smoked. She has never used smokeless tobacco. She reports current alcohol use. She reports that she does not use drugs.  Past Medical History:  Diagnosis Date   GERD (gastroesophageal reflux disease)    takes Omeprazole and Protonix daily   H/O hiatal hernia    History of bronchitis    15+yrs ago   History of colon polyps    History of vertigo    takes Antivert daily as needed   Joint pain    Meniere's disease    L  hearing loss related to same   Osteoporosis    PMB (postmenopausal bleeding) 12/07   proliferative endo. No HRT, PUS normal    Past Surgical History:  Procedure Laterality Date   ABSCESS DRAINAGE  09/2010   suprapubic   BTSP  1994   CATARACT EXTRACTION EXTRACAPSULAR Right 10/2014   COLONOSCOPY W/ BIOPSIES  12/12/05   hyperplastic polyp, recheck in 10 years   CYSTECTOMY     from buttock   GAS/FLUID EXCHANGE Right 12/23/2013   Procedure: GAS/FLUID EXCHANGE;  Surgeon: Corliss Parish, MD;  Location: [place];  Service: Ophthalmology;  Laterality: Right;  SF6   IRRIGATION AND [place]   MEMBRANE PEEL Right 12/23/2013   Procedure: MEMBRANE PEEL;  Surgeon: Corliss Parish, MD;  Location: [place];  Service: Ophthalmology;  Laterality: Right;   PARS PLANA VITRECTOMY Right 10/07/2013   Procedure: PARS PLANA VITRECTOMY WITH 25 GAUGE RIGHT EYE;  Surgeon: Corliss Parish, MD;  Location: [place];  Service: Ophthalmology;  Laterality: Right;   PARS PLANA VITRECTOMY Right 12/23/2013   Procedure: PARS PLANA VITRECTOMY WITH 25 GAUGE;  Surgeon: Corliss Parish, MD;  Location: [place];  Service: Ophthalmology;  Laterality: Right;   TUBAL LIGATION      Current Outpatient Medications  Medication Sig Dispense Refill   meclizine (ANTIVERT) 12.5 MG tablet Take 1 tablet (12.5 mg total) by mouth 3 (three) times daily as needed for dizziness. 30 tablet 0   metroNIDAZOLE (METROGEL) 0.75 % gel Apply a small amount to face twice a day for rosacea 45 g 4   Multiple Vitamins-Minerals (MULTIVITAMIN WITH MINERALS) tablet Take 1 tablet by mouth daily.     pantoprazole (PROTONIX) 40 MG tablet TAKE 1 TABLET BY MOUTH ONCE DAILY AS NEEDED FOR ACID REFLUX 90 tablet 3   No current facility-administered medications for this visit.    Family History  Problem Relation Age of Onset   Hypertension Mother    Atrial fibrillation Mother    Deep vein thrombosis Mother    Osteoporosis Mother        compression fx   Thyroid disease Mother    Osteoarthritis Mother    Arthritis Mother        shoulder replacement   Stroke Father    Anorexia  nervosa Sister    Osteoporosis Sister    Thyroid disease Sister    Diabetes Brother    Cancer Paternal Grandmother        multi-organ    Review of Systems  Exam:   LMP 01/10/2006   Weight change: '@WEIGHTCHANGE'$ @ Height:      Ht Readings from Last 3 Encounters:  05/25/21 '5\' 3"'$  (1.6 m)  11/26/20 '5\' 3"'$  (1.6 m)  05/11/20 5' 3.75" (1.619 m)    General appearance: alert, cooperative and appears stated age Head: Normocephalic, without obvious abnormality, atraumatic Neck: no adenopathy, supple, symmetrical, trachea midline and thyroid {CHL AMB PHY EX THYROID NORM DEFAULT:[phone]::"normal to inspection and palpation"} Lungs: clear to auscultation bilaterally Cardiovascular: regular rate and rhythm Breasts: {Exam; breast:13139::"normal  appearance, no masses or tenderness"} Abdomen: soft, non-tender; non distended,  no masses,  no organomegaly Extremities: extremities normal, atraumatic, no cyanosis or edema Skin: Skin color, texture, turgor normal. No rashes or lesions Lymph nodes: Cervical, supraclavicular, and axillary nodes normal. No abnormal inguinal nodes palpated Neurologic: Grossly normal   Pelvic: External genitalia:  no lesions              Urethra:  normal appearing urethra with no masses, tenderness or lesions              Bartholins and Skenes: normal                 Vagina: normal appearing vagina with normal color and discharge, no lesions              Cervix: {CHL AMB PHY EX CERVIX NORM [phone]::"no lesions"}               Bimanual Exam:  Uterus:  {CHL AMB PHY EX UTERUS NORM DEFAULT:[phone]::"normal size, contour, position, consistency, mobility, non-tender"}              Adnexa: {CHL AMB PHY EX ADNEXA NO MASS [phone]::"no mass, fullness, tenderness"}               Rectovaginal: Confirms               Anus:  normal sphincter tone, no lesions  *** chaperoned for the exam.  A:  Well Woman with normal exam  P:

## 2021-11-30 ENCOUNTER — Ambulatory Visit: Payer: 59 | Admitting: Obstetrics and Gynecology

## 2021-12-01 NOTE — Progress Notes (Signed)
67 y.o. G0P0000 Single White or Caucasian Not Hispanic or Latino female here for annual exam.    No vaginal bleeding. Lives with her long term partner, not sexually active secondary to ED.   H/O osteoporosis, declines treatment. On calcium and vit D.    Patient's last menstrual period was 01/10/2006.          Sexually active: No.  The current method of family planning is post menopausal status.    Exercising: No.   Patient has an active lifestyle  Smoker:  no  Health Maintenance: Pap:11/26/20 WNL Hr HPV Neg,  10-19-15 Neg:Neg HR HPV History of abnormal Pap:  no MMG:  06/22/21 Bi-Rads cat 1: negative BMD:  12/06/21 T score -2.7 (done with her primary); 11/22/19 osteoporotic, T score -2.8  Colonoscopy: 08/18/21 with Dr Collene Mares, no polyps, f/u in 5 years.  TDaP:  03/20/17  Gardasil: n/a    reports that she has never smoked. She has never used smokeless tobacco. She reports current alcohol use. She reports that she does not use drugs. Drinks 1-2 bottles of wine a week. 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 She helps takes care of a disabled brother, lives on his own.    Past Medical History:  Diagnosis Date   GERD (gastroesophageal reflux disease)    takes Omeprazole and Protonix daily   H/O hiatal hernia    History of bronchitis    15+yrs ago   History of colon polyps    History of vertigo    takes Antivert daily as needed   Joint pain    Meniere's disease    L  hearing loss related to same   Osteoporosis    PMB (postmenopausal bleeding) 12/07   proliferative endo. No HRT, PUS normal    Past Surgical History:  Procedure Laterality Date   ABSCESS DRAINAGE  09/2010   suprapubic   BTSP  1994   CATARACT EXTRACTION EXTRACAPSULAR Right 10/2014   COLONOSCOPY W/ BIOPSIES  12/12/05   hyperplastic polyp, recheck in 10 years   CYSTECTOMY     from buttock   GAS/FLUID EXCHANGE Right 12/23/2013   Procedure: GAS/FLUID EXCHANGE;  Surgeon: Corliss Parish, MD;  Location:  [place];  Service: Ophthalmology;  Laterality: Right;  SF6   IRRIGATION AND [place]   MEMBRANE PEEL Right 12/23/2013   Procedure: MEMBRANE PEEL;  Surgeon: Corliss Parish, MD;  Location: [place];  Service: Ophthalmology;  Laterality: Right;   PARS PLANA VITRECTOMY Right 10/07/2013   Procedure: PARS PLANA VITRECTOMY WITH 25 GAUGE RIGHT EYE;  Surgeon: Corliss Parish, MD;  Location: [facility];  Service: Ophthalmology;  Laterality: Right;   PARS PLANA VITRECTOMY Right 12/23/2013   Procedure: PARS PLANA VITRECTOMY WITH 25 GAUGE;  Surgeon: Corliss Parish, MD;  Location: [place];  Service: Ophthalmology;  Laterality: Right;   TUBAL LIGATION      Current Outpatient Medications  Medication Sig Dispense Refill   meclizine (ANTIVERT) 12.5 MG tablet Take 1 tablet (12.5 mg total) by mouth 3 (three) times daily as needed for dizziness. 30 tablet 0   metroNIDAZOLE (METROGEL) 0.75 % gel Apply a small amount to face twice a day for rosacea 45 g 4   Multiple Vitamins-Minerals (MULTIVITAMIN WITH MINERALS) tablet Take 1 tablet by mouth daily.     pantoprazole (PROTONIX) 40 MG tablet TAKE 1 TABLET BY MOUTH ONCE DAILY AS NEEDED FOR ACID REFLUX 90 tablet 3  No current facility-administered medications for this visit.    Family History  Problem Relation Age of Onset   Hypertension Mother    Atrial fibrillation Mother    Deep vein thrombosis Mother    Osteoporosis Mother        compression fx   Thyroid disease Mother    Osteoarthritis Mother    Arthritis Mother        shoulder replacement   Stroke Father    Anorexia nervosa Sister    Osteoporosis Sister    Thyroid disease Sister    Diabetes Brother    Cancer Paternal Grandmother        multi-organ    Review of Systems  All other systems reviewed and are negative.   Exam:   BP 124/84   Pulse 66   Ht 5' 3.39" (1.61 m)   Wt 136 lb (61.7 kg)   LMP 01/10/2006   SpO2 100%   BMI 23.80 kg/m   Weight change: '@WEIGHTCHANGE'$ @  Height:   Height: 5' 3.39" (161 cm)  Ht Readings from Last 3 Encounters:  12/13/21 5' 3.39" (1.61 m)  05/25/21 '5\' 3"'$  (1.6 m)  11/26/20 '5\' 3"'$  (1.6 m)    General appearance: alert, cooperative and appears stated age Head: Normocephalic, without obvious abnormality, atraumatic Neck: no adenopathy, supple, symmetrical, trachea midline and thyroid normal to inspection and palpation Lungs: clear to auscultation bilaterally Cardiovascular: regular rate and rhythm Breasts: normal appearance, no masses or tenderness Abdomen: soft, non-tender; non distended,  no masses,  no organomegaly Extremities: extremities normal, atraumatic, no cyanosis or edema Skin: Skin color, texture, turgor normal. No rashes or lesions Lymph nodes: Cervical, supraclavicular, and axillary nodes normal. No abnormal inguinal nodes palpated Neurologic: Grossly normal   Pelvic: External genitalia:  no lesions              Urethra:  normal appearing urethra with no masses, tenderness or lesions              Bartholins and Skenes: normal                 Vagina: atrophic appearing vagina with normal color and discharge, no lesions              Cervix: no lesions               Bimanual Exam:  Uterus:  normal size, contour, position, consistency, mobility, non-tender              Adnexa: no mass, fullness, tenderness               Rectovaginal: Confirms               Anus:  normal sphincter tone, no lesions  Gae Dry, CMA chaperoned for the exam.  1. Well woman exam No pap this year Mammogram and colonoscopy UTD Labs with primary Discussed breast self exam  2. Osteoporosis, unspecified osteoporosis type, unspecified pathological fracture presence Recent DEXA with her primary was stable. We discussed calcium, vit d, weigh bearing exercise and balance She declines treatment  3. Vitamin D deficiency Recent vit d level was mildly low Recommended she add an additional 1,000 IU of vit d3 daily (long term)

## 2021-12-06 ENCOUNTER — Ambulatory Visit (INDEPENDENT_AMBULATORY_CARE_PROVIDER_SITE_OTHER)
Admission: RE | Admit: 2021-12-06 | Discharge: 2021-12-06 | Disposition: A | Discharge status: Home / Self Care | Payer: 59 | Source: Ambulatory Visit | Attending: Internal Medicine | Admitting: Internal Medicine

## 2021-12-06 DIAGNOSIS — M810 Age-related osteoporosis without current pathological fracture: Secondary | ICD-10-CM | POA: Diagnosis not present

## 2021-12-13 ENCOUNTER — Encounter: Payer: Self-pay | Admitting: Obstetrics and Gynecology

## 2021-12-13 ENCOUNTER — Ambulatory Visit (INDEPENDENT_AMBULATORY_CARE_PROVIDER_SITE_OTHER): Payer: 59 | Admitting: Obstetrics and Gynecology

## 2021-12-13 VITALS — BP 124/84 | HR 66 | Ht 63.39 in | Wt 136.0 lb

## 2021-12-13 DIAGNOSIS — Z83719 Family history of colon polyps, unspecified: Secondary | ICD-10-CM | POA: Insufficient documentation

## 2021-12-13 DIAGNOSIS — Z01419 Encounter for gynecological examination (general) (routine) without abnormal findings: Secondary | ICD-10-CM

## 2021-12-13 DIAGNOSIS — K5730 Diverticulosis of large intestine without perforation or abscess without bleeding: Secondary | ICD-10-CM | POA: Insufficient documentation

## 2021-12-13 DIAGNOSIS — M810 Age-related osteoporosis without current pathological fracture: Secondary | ICD-10-CM

## 2021-12-13 DIAGNOSIS — E559 Vitamin D deficiency, unspecified: Secondary | ICD-10-CM

## 2021-12-13 NOTE — Patient Instructions (Signed)

## 2021-12-15 ENCOUNTER — Other Ambulatory Visit (HOSPITAL_COMMUNITY): Payer: Self-pay

## 2022-01-03 ENCOUNTER — Encounter (HOSPITAL_COMMUNITY): Payer: Self-pay

## 2022-01-03 ENCOUNTER — Emergency Department (HOSPITAL_COMMUNITY)
Admission: EM | Admit: 2022-01-03 | Discharge: 2022-01-03 | Disposition: A | Discharge status: Home / Self Care | Payer: 59 | Attending: Emergency Medicine | Admitting: Emergency Medicine

## 2022-01-03 ENCOUNTER — Emergency Department (HOSPITAL_COMMUNITY): Payer: 59

## 2022-01-03 ENCOUNTER — Other Ambulatory Visit: Payer: Self-pay

## 2022-01-03 DIAGNOSIS — Z79899 Other long term (current) drug therapy: Secondary | ICD-10-CM | POA: Insufficient documentation

## 2022-01-03 DIAGNOSIS — J069 Acute upper respiratory infection, unspecified: Secondary | ICD-10-CM | POA: Insufficient documentation

## 2022-01-03 DIAGNOSIS — Z91040 Latex allergy status: Secondary | ICD-10-CM | POA: Diagnosis not present

## 2022-01-03 DIAGNOSIS — R509 Fever, unspecified: Secondary | ICD-10-CM | POA: Diagnosis present

## 2022-01-03 DIAGNOSIS — B9789 Other viral agents as the cause of diseases classified elsewhere: Secondary | ICD-10-CM | POA: Diagnosis not present

## 2022-01-03 DIAGNOSIS — I471 Supraventricular tachycardia, unspecified: Secondary | ICD-10-CM | POA: Insufficient documentation

## 2022-01-03 DIAGNOSIS — Z1152 Encounter for screening for COVID-19: Secondary | ICD-10-CM | POA: Insufficient documentation

## 2022-01-03 DIAGNOSIS — R000 Tachycardia, unspecified: Secondary | ICD-10-CM | POA: Diagnosis not present

## 2022-01-03 LAB — CBC WITH DIFFERENTIAL/PLATELET
Abs Immature Granulocytes: 0.05 10*3/uL (ref 0.00–0.07)
Basophils Absolute: 0.1 10*3/uL (ref 0.0–0.1)
Basophils Relative: 0 %
Eosinophils Absolute: 0.3 10*3/uL (ref 0.0–0.5)
Eosinophils Relative: 2 %
HCT: 44 % (ref 36.0–46.0)
Hemoglobin: 15 g/dL (ref 12.0–15.0)
Immature Granulocytes: 0 %
Lymphocytes Relative: 29 %
Lymphs Abs: 4.6 10*3/uL — ABNORMAL HIGH (ref 0.7–4.0)
MCH: 31.7 pg (ref 26.0–34.0)
MCHC: 34.1 g/dL (ref 30.0–36.0)
MCV: 93 fL (ref 80.0–100.0)
Monocytes Absolute: 2 10*3/uL — ABNORMAL HIGH (ref 0.1–1.0)
Monocytes Relative: 12 %
Neutro Abs: 9.1 10*3/uL — ABNORMAL HIGH (ref 1.7–7.7)
Neutrophils Relative %: 57 %
Platelets: 316 10*3/uL (ref 150–400)
RBC: 4.73 MIL/uL (ref 3.87–5.11)
RDW: 12.1 % (ref 11.5–15.5)
WBC: 16.1 10*3/uL — ABNORMAL HIGH (ref 4.0–10.5)
nRBC: 0 % (ref 0.0–0.2)

## 2022-01-03 LAB — COMPREHENSIVE METABOLIC PANEL
ALT: 22 U/L (ref 0–44)
AST: 23 U/L (ref 15–41)
Albumin: 4.8 g/dL (ref 3.5–5.0)
Alkaline Phosphatase: 58 U/L (ref 38–126)
Anion gap: 13 (ref 5–15)
BUN: 15 mg/dL (ref 8–23)
CO2: 24 mmol/L (ref 22–32)
Calcium: 9.2 mg/dL (ref 8.9–10.3)
Chloride: 103 mmol/L (ref 98–111)
Creatinine, Ser: 0.86 mg/dL (ref 0.44–1.00)
GFR, Estimated: >60 mL/min (ref >60)
Glucose, Bld: 134 mg/dL — ABNORMAL HIGH (ref 70–99)
Potassium: 3.6 mmol/L (ref 3.5–5.1)
Sodium: 140 mmol/L (ref 135–145)
Total Bilirubin: 1 mg/dL (ref 0.3–1.2)
Total Protein: 8.5 g/dL — ABNORMAL HIGH (ref 6.5–8.1)

## 2022-01-03 LAB — RESP PANEL BY RT-PCR (RSV, FLU A&B, COVID)  RVPGX2
Influenza A by PCR: NEGATIVE
Influenza B by PCR: NEGATIVE
Resp Syncytial Virus by PCR: NEGATIVE
SARS Coronavirus 2 by RT PCR: NEGATIVE

## 2022-01-03 LAB — MAGNESIUM: Magnesium: 2.3 mg/dL (ref 1.7–2.4)

## 2022-01-03 MED ORDER — METOPROLOL TARTRATE 25 MG PO TABS: 12.5000 mg | ORAL_TABLET | Freq: Two times a day (BID) | ORAL | 0 refills | Status: DC

## 2022-01-03 MED ORDER — AMOXICILLIN-POT CLAVULANATE 875-125 MG PO TABS: 1.0000 | ORAL_TABLET | Freq: Two times a day (BID) | ORAL | 0 refills | Status: AC

## 2022-01-03 MED ORDER — SODIUM CHLORIDE 0.9 % IV BOLUS
1000.0000 mL | Freq: Once | INTRAVENOUS | Status: AC
  Administered 2022-01-03: 1000 mL via INTRAVENOUS

## 2022-01-03 MED ORDER — ADENOSINE 6 MG/2ML IV SOLN
6.0000 mg | Freq: Once | INTRAVENOUS | Status: AC
Start: 2022-01-03 — End: 2022-01-03
  Administered 2022-01-03: 6 mg via INTRAVENOUS
  Filled 2022-01-03: qty 2

## 2022-01-03 NOTE — ED Triage Notes (Signed)
"  I have had a cough and sinus congestion x 4 days, tested for covid and flu and it was negative. Got up this morning feeling fine. Was feeding dog and suddenly felt my heart start racing" per pt

## 2022-01-03 NOTE — ED Provider Notes (Signed)
[facility] DEPT Provider Note   CSN: [id number] Arrival date & time: 01/03/22  8676     History  Chief Complaint  Patient presents with   Palpitations   Fever    [name] is a 66 y.o. female.  HPI     66 year old female with history of GERD, osteoporosis, presents with concern for palpitations.   Reports cough and sinus congestion over the last 4 days.  Had a fever on Saturday, but that resolved.  Had otherwise been feeling better.  Today, did not have cough, shortness of breath or other concerns.  Did have some wine and bourbon last night on Christmas Eve.  This morning, had a sip of coffee and suddenly felt her heart racing and palpitations.  Describes some associated heaviness at the time she was having the palpitations.  Denies any other shortness of breath, nausea, vomiting, black or bloody stools, diarrhea.  Denies recent over-the-counter medication use or other medication changes.  No history of prior cardiac arrhythmia.  Past Medical History:  Diagnosis Date   GERD (gastroesophageal reflux disease)    takes Omeprazole and Protonix daily   H/O hiatal hernia    History of bronchitis    15+yrs ago   History of colon polyps    History of vertigo    takes Antivert daily as needed   Joint pain    Meniere's disease    L  hearing loss related to same   Osteoporosis    PMB (postmenopausal bleeding) 12/07   proliferative endo. No HRT, PUS normal     Home Medications Prior to Admission medications   Medication Sig Start Date End Date Taking? Authorizing Provider  amoxicillin-clavulanate (AUGMENTIN) 875-125 MG tablet Take 1 tablet by mouth every 12 (twelve) hours for 7 days. 01/03/22 01/10/22 Yes Gareth Morgan, MD  metoprolol tartrate (LOPRESSOR) 25 MG tablet Take 0.5 tablets (12.5 mg total) by mouth 2 (two) times daily. 01/03/22 02/02/22 Yes Gareth Morgan, MD  meclizine (ANTIVERT) 12.5 MG tablet Take 1 tablet (12.5 mg total) by  mouth 3 (three) times daily as needed for dizziness. 09/07/21   Hoyt Koch, MD  metroNIDAZOLE (METROGEL) 0.75 % gel Apply a small amount to face twice a day for rosacea 06/01/21     Multiple Vitamins-Minerals (MULTIVITAMIN WITH MINERALS) tablet Take 1 tablet by mouth daily.    [provider]  pantoprazole (PROTONIX) 40 MG tablet TAKE 1 TABLET BY MOUTH ONCE DAILY AS NEEDED FOR ACID REFLUX 05/25/21   Hoyt Koch, MD      Allergies    Latex and Sulfonamide derivatives    Review of Systems   Review of Systems  Physical Exam Updated Vital Signs BP (!) 147/95   Pulse 93   Temp 98.2 F (36.8 C)   Resp (!) 23   Ht '5\' 2"'$  (1.575 m)   Wt 63 kg   LMP 01/10/2006   SpO2 96%   BMI 25.42 kg/m  Physical Exam Vitals and nursing note reviewed.  Constitutional:      General: She is not in acute distress.    Appearance: She is well-developed. She is not diaphoretic.  HENT:     Head: Normocephalic and atraumatic.  Eyes:     Conjunctiva/sclera: Conjunctivae normal.  Cardiovascular:     Rate and Rhythm: Regular rhythm. Tachycardia present.     Heart sounds: Normal heart sounds. No murmur heard.    No friction rub. No gallop.  Pulmonary:  Effort: Pulmonary effort is normal. No respiratory distress.     Breath sounds: Normal breath sounds. No wheezing or rales.  Abdominal:     General: There is no distension.     Palpations: Abdomen is soft.     Tenderness: There is no abdominal tenderness. There is no guarding.  Musculoskeletal:        General: No tenderness.     Cervical back: Normal range of motion.  Skin:    General: Skin is warm and dry.     Findings: No erythema or rash.  Neurological:     Mental Status: She is alert and oriented to person, place, and time.     ED Results / Procedures / Treatments   Labs (all labs ordered are listed, but only abnormal results are displayed) Labs Reviewed  CBC WITH DIFFERENTIAL/PLATELET - Abnormal; Notable for the  following components:      Result Value   WBC 16.1 (*)    Neutro Abs 9.1 (*)    Lymphs Abs 4.6 (*)    Monocytes Absolute 2.0 (*)    All other components within normal limits  COMPREHENSIVE METABOLIC PANEL - Abnormal; Notable for the following components:   Glucose, Bld 134 (*)    Total Protein 8.5 (*)    All other components within normal limits  RESP PANEL BY RT-PCR (RSV, FLU A&B, COVID)  RVPGX2  MAGNESIUM    EKG SVT rate 150s Rate related ST changes.  Radiology DG Chest Portable 1 View  Result Date: 01/03/2022 CLINICAL DATA:  Tachycardia EXAM: PORTABLE CHEST 1 VIEW COMPARISON:  None available. FINDINGS: The cardiomediastinal silhouette is within normal limits. Defibrillator pad overlies the left lower chest. There is no focal airspace consolidation. There is no pleural effusion or evidence of pneumothorax. There is no acute osseous abnormality. Bilateral shoulder degenerative changes. Thoracic spondylosis. IMPRESSION: No evidence of acute cardiopulmonary disease. Electronically Signed   By: Maurine Simmering M.D.   On: 01/03/2022 09:47    Procedures .Critical Care  Performed by: Gareth Morgan, MD Authorized by: Gareth Morgan, MD   Critical care provider statement:    Critical care time (minutes):  30   Critical care was time spent personally by me on the following activities:  Development of treatment plan with patient or surrogate, evaluation of patient's response to treatment, examination of patient, ordering and review of laboratory studies, ordering and review of radiographic studies, ordering and performing treatments and interventions, pulse oximetry, re-evaluation of patient's condition and review of old charts     Medications Ordered in ED Medications  adenosine (ADENOCARD) 6 MG/2ML injection 6 mg (6 mg Intravenous Given 01/03/22 0908)  sodium chloride 0.9 % bolus 1,000 mL (0 mLs Intravenous Stopped 01/03/22 1017)    ED Course/ Medical Decision Making/ A&P                             66 year old female with history of GERD, osteoporosis, presents with concern for palpitations.  EKG completed and personally evaluated interpreted by me shows SVT with rate related ST changes.  Attempted vagal maneuvers without conversion.  Given 6 mg of IV adenosine with conversion to sinus rhythm.  Labs completed and personally evaluated interpreted by me show a leukocytosis, no anemia, no significant electrolyte abnormalities, normal magnesium.  Chest x-ray was completed and personally evaluated interpreted by me and showed no evidence of pneumonia, pulmonary edema or other abnormalities.  Now that she is back in  normal sinus rhythm, she feels back to baseline.  No continuing chest discomfort or dyspnea.  Have low suspicion for ACS, pulmonary embolus given improvement of symptoms, no other risk factors.  Suspect SVT triggered by recent viral illness with URI symptoms and husband as sick contact with etoh last night contributing.  Do not suspect bacterial infection or see indication for admission to hospital.  She feels back to baseline following conversion to sinus rhythm.  Will start metoprolol 12.'5mg'$  BID and refer to Cardiology.  Discussed possible triggers. Patient discharged in stable condition with understanding of reasons to return. .          Final Clinical Impression(s) / ED Diagnoses Final diagnoses:  Viral URI  SVT (supraventricular tachycardia)    Rx / DC Orders ED Discharge Orders          Ordered    metoprolol tartrate (LOPRESSOR) 25 MG tablet  2 times daily        01/03/22 1151    Ambulatory referral to Cardiology        01/03/22 1152    amoxicillin-clavulanate (AUGMENTIN) 875-125 MG tablet  Every 12 hours        01/03/22 1152              Gareth Morgan, MD 01/03/22 2241

## 2022-01-03 NOTE — ED Notes (Signed)
Ambulatory to bathroom to void. Gait steady. No chest pain/ shortness of breath noted. Marland Kitchen

## 2022-01-18 ENCOUNTER — Encounter: Payer: Self-pay | Admitting: Internal Medicine

## 2022-01-21 ENCOUNTER — Encounter: Payer: Self-pay | Admitting: Internal Medicine

## 2022-01-26 ENCOUNTER — Ambulatory Visit: Payer: 59 | Attending: Internal Medicine | Admitting: Cardiovascular Disease

## 2022-01-26 ENCOUNTER — Encounter: Payer: Self-pay | Admitting: Cardiovascular Disease

## 2022-01-26 ENCOUNTER — Other Ambulatory Visit (HOSPITAL_COMMUNITY): Payer: Self-pay

## 2022-01-26 VITALS — BP 122/72 | HR 70 | Ht 62.0 in | Wt 139.4 lb

## 2022-01-26 DIAGNOSIS — I471 Supraventricular tachycardia, unspecified: Secondary | ICD-10-CM | POA: Diagnosis not present

## 2022-01-26 MED ORDER — METOPROLOL SUCCINATE ER 25 MG PO TB24
25.0000 mg | ORAL_TABLET | Freq: Every day | ORAL | 3 refills | Status: DC
  Filled 2022-01-26: qty 90, 90d supply, fill #0

## 2022-01-26 NOTE — Patient Instructions (Addendum)
Medication Instructions:  Your physician has recommended you make the following change in your medication:  1.) stop metoprolol tartrate (Lopressor) 2.) start metoprolol succinate (Toprol XL) 25 mg - take one tablet daily  *If you need a refill on your cardiac medications before your next appointment, please call your pharmacy*   Lab Work: none If you have labs (blood work) drawn today and your tests are completely normal, you will receive your results only by: [facility] (if you have MyChart) OR A paper copy in the mail If you have any lab test that is abnormal or we need to change your treatment, we will call you to review the results.   Testing/Procedures: Your physician has requested that you have an echocardiogram. Echocardiography is a painless test that uses sound waves to create images of your heart. It provides your doctor with information about the size and shape of your heart and how well your heart's chambers and valves are working. This procedure takes approximately one hour. There are no restrictions for this procedure. Please do NOT wear cologne, perfume, aftershave, or lotions (deodorant is allowed). Please arrive 15 minutes prior to your appointment time.   Follow-Up: At [facility], you and your health needs are our priority.  As part of our continuing mission to provide you with exceptional heart care, we have created designated Provider Care Teams.  These Care Teams include your primary Cardiologist (physician) and Advanced Practice Providers (APPs -  Physician Assistants and Nurse Practitioners) who all work together to provide you with the care you need, when you need it.    Your next appointment:   2-3 month(s)  Provider:   Lauree Chandler, MD

## 2022-01-26 NOTE — Progress Notes (Signed)
Chief Complaint  Patient presents with   Follow-up    SVT   History of Present Illness: 67 yo female with history of GERD, hiatal hernia, Meniere's disease who is here today as a new consult, referred by Dr. Sharlet Salina, for the evaluation of SVT. She was seen in the ED at [facility] on 01/03/22 with palpitations and was found to have SVT. She converted to sinus with IV adenosine. She had been recovering from a viral illness and had consumed alcohol on the night prior to her episode of SVT. She was discharged home with metoprolol BID.   Primary Care Physician: Hoyt Koch, MD   Past Medical History:  Diagnosis Date   GERD (gastroesophageal reflux disease)    takes Omeprazole and Protonix daily   H/O hiatal hernia    History of bronchitis    15+yrs ago   History of colon polyps    History of vertigo    takes Antivert daily as needed   Joint pain    Meniere's disease    L  hearing loss related to same   Osteoporosis    PMB (postmenopausal bleeding) 12/07   proliferative endo. No HRT, PUS normal    Past Surgical History:  Procedure Laterality Date   ABSCESS DRAINAGE  09/2010   suprapubic   BTSP  1994   CATARACT EXTRACTION EXTRACAPSULAR Right 10/2014   COLONOSCOPY W/ BIOPSIES  12/12/05   hyperplastic polyp, recheck in 10 years   CYSTECTOMY     from buttock   GAS/FLUID EXCHANGE Right 12/23/2013   Procedure: GAS/FLUID EXCHANGE;  Surgeon: Corliss Parish, MD;  Location: [place];  Service: Ophthalmology;  Laterality: Right;  SF6   IRRIGATION AND [place]   MEMBRANE PEEL Right 12/23/2013   Procedure: MEMBRANE PEEL;  Surgeon: Corliss Parish, MD;  Location: [place];  Service: Ophthalmology;  Laterality: Right;   PARS PLANA VITRECTOMY Right 10/07/2013   Procedure: PARS PLANA VITRECTOMY WITH 25 GAUGE RIGHT EYE;  Surgeon: Corliss Parish, MD;  Location: [facility];  Service: Ophthalmology;  Laterality: Right;   PARS PLANA VITRECTOMY Right 12/23/2013    Procedure: PARS PLANA VITRECTOMY WITH 25 GAUGE;  Surgeon: Corliss Parish, MD;  Location: [facility];  Service: Ophthalmology;  Laterality: Right;   TUBAL LIGATION      Current Outpatient Medications  Medication Sig Dispense Refill   meclizine (ANTIVERT) 12.5 MG tablet Take 1 tablet (12.5 mg total) by mouth 3 (three) times daily as needed for dizziness. 30 tablet 0   metoprolol succinate (TOPROL XL) 25 MG 24 hr tablet Take 1 tablet (25 mg total) by mouth daily. 90 tablet 3   metroNIDAZOLE (METROGEL) 0.75 % gel Apply a small amount to face twice a day for rosacea 45 g 4   Multiple Vitamins-Minerals (MULTIVITAMIN WITH MINERALS) tablet Take 1 tablet by mouth daily.     pantoprazole (PROTONIX) 40 MG tablet TAKE 1 TABLET BY MOUTH ONCE DAILY AS NEEDED FOR ACID REFLUX 90 tablet 3   No current facility-administered medications for this visit.    Allergies  Allergen Reactions   Latex     Contact dermatitis   Sulfonamide Derivatives     Childhood allergy, kidney problems    Social History   Socioeconomic History   Marital status: Single    Spouse name: Not on file   Number of children: 0   Years of education: Not on file   Highest education level: Not on  file  Occupational History   Occupation: Physical therapist  Tobacco Use   Smoking status: Never   Smokeless tobacco: Never  Vaping Use   Vaping Use: Never used  Substance and Sexual Activity   Alcohol use: Yes    Comment: socially   Drug use: No   Sexual activity: Not Currently    Birth control/protection: Post-menopausal    Comment: BTL  Other Topics Concern   Not on file  Social History Narrative   Not on file   Social Determinants of Health   Financial Resource Strain: Not on file  Food Insecurity: Not on file  Transportation Needs: Not on file  Physical Activity: Not on file  Stress: Not on file  Social Connections: Not on file  Intimate Partner Violence: Not on file    Family History  Problem Relation Age of Onset    Hypertension Mother    Atrial fibrillation Mother    Deep vein thrombosis Mother    Osteoporosis Mother        compression fx   Thyroid disease Mother    Osteoarthritis Mother    Arthritis Mother        shoulder replacement   Stroke Father    Anorexia nervosa Sister    Osteoporosis Sister    Thyroid disease Sister    Diabetes Brother    Cancer Paternal Grandmother        multi-organ    Review of Systems:  As stated in the HPI and otherwise negative.   BP 122/72   Pulse 70   Ht '5\' 2"'$  (1.575 m)   Wt 63.2 kg   LMP 01/10/2006   SpO2 97%   BMI 25.50 kg/m   Physical Examination: General: Well developed, well nourished, NAD  HEENT: OP clear, mucus membranes moist  SKIN: warm, dry. No rashes. Neuro: No focal deficits  Musculoskeletal: Muscle strength 5/5 all ext  Psychiatric: Mood and affect normal  Neck: No JVD, no carotid bruits, no thyromegaly, no lymphadenopathy.  Lungs:Clear bilaterally, no wheezes, rhonci, crackles Cardiovascular: Regular rate and rhythm. No murmurs, gallops or rubs. Abdomen:Soft. Bowel sounds present. Non-tender.  Extremities: No lower extremity edema. Pulses are 2 + in the bilateral DP/PT.  EKG:  EKG is ordered today. The ekg ordered today demonstrates NSR  Recent Labs: 01/03/2022: ALT 22; BUN 15; Creatinine, Ser 0.86; Hemoglobin 15.0; Magnesium 2.3; Platelets 316; Potassium 3.6; Sodium 140   Lipid Panel    Component Value Date/Time   CHOL 222 (H) 05/25/2021 1134   CHOL 213 (H) 11/14/2019 0841   TRIG 133.0 05/25/2021 1134   TRIG 81 10/15/2008 0000   HDL 81.40 05/25/2021 1134   HDL 72 11/14/2019 0841   CHOLHDL 3 05/25/2021 1134   VLDL 26.6 05/25/2021 1134   LDLCALC 114 (H) 05/25/2021 1134   LDLCALC 112 (H) 11/26/2020 1640   LDLDIRECT 107.4 05/14/2012 0736     Wt Readings from Last 3 Encounters:  01/26/22 63.2 kg  01/03/22 63 kg  12/13/21 61.7 kg    Assessment and Plan:   1. SVT: She is in sinus today. Will arrange an echo to  assess LV function and exclude structural heart disease. Will change Lopressor 12.5 mg po BID to Toprol 25 mg daily. Vagal maneuvers are reviewed.   Labs/ tests ordered today include:   Orders Placed This Encounter  Procedures   EKG 12-Lead   ECHOCARDIOGRAM COMPLETE   Disposition:   F/U with me in 3 months  Signed, Lauree Chandler, MD, [facility] 01/26/2022  2:17 PM    [facility] [address], [place], [place]  [address] Phone: [phone]; Fax: [phone]

## 2022-01-31 ENCOUNTER — Ambulatory Visit: Payer: Commercial Managed Care - PPO | Admitting: Internal Medicine

## 2022-02-01 ENCOUNTER — Ambulatory Visit: Payer: 59 | Admitting: Internal Medicine

## 2022-02-01 ENCOUNTER — Other Ambulatory Visit (HOSPITAL_COMMUNITY): Payer: Self-pay

## 2022-02-01 ENCOUNTER — Encounter: Payer: Self-pay | Admitting: Internal Medicine

## 2022-02-01 VITALS — BP 118/78 | Temp 98.7°F | Ht 62.0 in | Wt 139.0 lb

## 2022-02-01 DIAGNOSIS — R052 Subacute cough: Secondary | ICD-10-CM | POA: Diagnosis not present

## 2022-02-01 DIAGNOSIS — I471 Supraventricular tachycardia, unspecified: Secondary | ICD-10-CM

## 2022-02-01 LAB — TSH: TSH: 2.76 u[IU]/mL (ref 0.35–5.50)

## 2022-02-01 LAB — HEMOGLOBIN A1C: Hgb A1c MFr Bld: 5.9 % (ref 4.6–6.5)

## 2022-02-01 LAB — T4, FREE: Free T4: 0.73 ng/dL (ref 0.60–1.60)

## 2022-02-01 MED ORDER — BENZONATATE 200 MG PO CAPS
200.0000 mg | ORAL_CAPSULE | Freq: Three times a day (TID) | ORAL | 0 refills | Status: DC | PRN
  Filled 2022-02-01: qty 60, 20d supply, fill #0

## 2022-02-01 NOTE — Progress Notes (Signed)
   Subjective:   Patient ID: [name], female    DOB: Jul 22, 1955, 67 y.o.   MRN: [id number]  HPI The patient is a 67 YO female coming in for new SVT follow up.  Review of Systems  Constitutional: Negative.   HENT: Negative.    Eyes: Negative.   Respiratory:  Positive for cough. Negative for chest tightness and shortness of breath.   Cardiovascular:  Positive for palpitations. Negative for chest pain and leg swelling.  Gastrointestinal:  Negative for abdominal distention, abdominal pain, constipation, diarrhea, nausea and vomiting.  Musculoskeletal: Negative.   Skin: Negative.   Neurological: Negative.   Psychiatric/Behavioral: Negative.      Objective:  Physical Exam Constitutional:      Appearance: She is well-developed.  HENT:     Head: Normocephalic and atraumatic.  Cardiovascular:     Rate and Rhythm: Normal rate and regular rhythm.  Pulmonary:     Effort: Pulmonary effort is normal. No respiratory distress.     Breath sounds: Normal breath sounds. No wheezing or rales.  Abdominal:     General: Bowel sounds are normal. There is no distension.     Palpations: Abdomen is soft.     Tenderness: There is no abdominal tenderness. There is no rebound.  Musculoskeletal:     Cervical back: Normal range of motion.  Skin:    General: Skin is warm and dry.  Neurological:     Mental Status: She is alert and oriented to person, place, and time.     Coordination: Coordination normal.     Vitals:   02/01/22 1552  BP: 118/78  Temp: 98.7 F (37.1 C)  TempSrc: Oral  Weight: 139 lb (63 kg)  Height: '5\' 2"'$  (1.575 m)    Assessment & Plan:  Visit time 25 minutes in face to face communication with patient and coordination of care, additional 5 minutes spent in record review, coordination or care, ordering tests, communicating/referring to other healthcare professionals, documenting in medical records all on the same day of the visit for total time 30 minutes spent on the visit.

## 2022-02-01 NOTE — Patient Instructions (Signed)
We will check the labs.

## 2022-02-04 DIAGNOSIS — I471 Supraventricular tachycardia, unspecified: Secondary | ICD-10-CM | POA: Insufficient documentation

## 2022-02-04 DIAGNOSIS — R052 Subacute cough: Secondary | ICD-10-CM | POA: Insufficient documentation

## 2022-02-04 NOTE — Assessment & Plan Note (Signed)
Recent episode of SVT. Needs thyroid assessment ordered TSH and free T4 and HgA1c (due to elevated sugar on ER labs). Seeing cardiology and no recurrence on metoprolol-xl 25 mg daily.

## 2022-02-04 NOTE — Assessment & Plan Note (Signed)
Suspect post-viral cough. Rx tessalon perles to help. Lungs clear on exam.

## 2022-02-07 ENCOUNTER — Ambulatory Visit: Payer: Commercial Managed Care - PPO | Admitting: Internal Medicine

## 2022-02-18 ENCOUNTER — Ambulatory Visit (HOSPITAL_COMMUNITY): Payer: 59 | Attending: Cardiology

## 2022-02-18 DIAGNOSIS — I340 Nonrheumatic mitral (valve) insufficiency: Secondary | ICD-10-CM | POA: Diagnosis not present

## 2022-02-18 DIAGNOSIS — I471 Supraventricular tachycardia, unspecified: Secondary | ICD-10-CM | POA: Diagnosis not present

## 2022-02-18 LAB — ECHOCARDIOGRAM COMPLETE
Area-P 1/2: 4.74 cm2
S' Lateral: 2.8 cm

## 2022-03-31 ENCOUNTER — Other Ambulatory Visit (HOSPITAL_COMMUNITY): Payer: Self-pay

## 2022-03-31 ENCOUNTER — Other Ambulatory Visit: Payer: Self-pay

## 2022-04-02 ENCOUNTER — Other Ambulatory Visit (HOSPITAL_COMMUNITY): Payer: Self-pay

## 2022-04-17 NOTE — Progress Notes (Unsigned)
No chief complaint on file.  History of Present Illness: 67 yo female with history of GERD, hiatal hernia, Meniere's disease and SVT who is here today for cardiac follow up. I saw her as a new patient in January 2024 for the evaluation of SVT. She was seen in the ED at [facility] on 01/03/22 with palpitations and was found to have SVT. She converted to sinus with IV adenosine. She had been recovering from a viral illness and had consumed alcohol on the night prior to her episode of SVT. She was discharged home with metoprolol BID. When I saw her in January 2024 she was feeling well. Her metoprolol was changed to Toprol. Echo February 2024 with LVEF=60-65%. Mild mitral regurgitation.   She is here today for follow up. The patient denies any chest pain, dyspnea, palpitations, lower extremity edema, orthopnea, PND, dizziness, near syncope or syncope.   Primary Care Physician: Myrlene Broker, MD   Past Medical History:  Diagnosis Date   GERD (gastroesophageal reflux disease)    takes Omeprazole and Protonix daily   H/O hiatal hernia    History of bronchitis    15+yrs ago   History of colon polyps    History of vertigo    takes Antivert daily as needed   Joint pain    Meniere's disease    L  hearing loss related to same   Osteoporosis    PMB (postmenopausal bleeding) 12/07   proliferative endo. No HRT, PUS normal    Past Surgical History:  Procedure Laterality Date   ABSCESS DRAINAGE  09/2010   suprapubic   BTSP  1994   CATARACT EXTRACTION EXTRACAPSULAR Right 10/2014   COLONOSCOPY W/ BIOPSIES  12/12/05   hyperplastic polyp, recheck in 10 years   CYSTECTOMY     from buttock   GAS/FLUID EXCHANGE Right 12/23/2013   Procedure: GAS/FLUID EXCHANGE;  Surgeon: Donnel Saxon, MD;  Location: [facility] OR;  Service: Ophthalmology;  Laterality: Right;  SF6   IRRIGATION AND DEBRIDEMENT SEBACEOUS CYST  1980 & 1985   MEMBRANE PEEL Right 12/23/2013   Procedure: MEMBRANE PEEL;  Surgeon: Donnel Saxon, MD;  Location: [facility] OR;  Service: Ophthalmology;  Laterality: Right;   PARS PLANA VITRECTOMY Right 10/07/2013   Procedure: PARS PLANA VITRECTOMY WITH 25 GAUGE RIGHT EYE;  Surgeon: Donnel Saxon, MD;  Location: [facility] OR;  Service: Ophthalmology;  Laterality: Right;   PARS PLANA VITRECTOMY Right 12/23/2013   Procedure: PARS PLANA VITRECTOMY WITH 25 GAUGE;  Surgeon: Donnel Saxon, MD;  Location: [facility] OR;  Service: Ophthalmology;  Laterality: Right;   TUBAL LIGATION      Current Outpatient Medications  Medication Sig Dispense Refill   benzonatate (TESSALON) 200 MG capsule Take 1 capsule (200 mg total) by mouth 3 (three) times daily as needed. 60 capsule 0   meclizine (ANTIVERT) 12.5 MG tablet Take 1 tablet (12.5 mg total) by mouth 3 (three) times daily as needed for dizziness. 30 tablet 0   metoprolol succinate (TOPROL XL) 25 MG 24 hr tablet Take 1 tablet (25 mg total) by mouth daily. 90 tablet 3   metroNIDAZOLE (METROGEL) 0.75 % gel Apply a small amount to face twice a day for rosacea 45 g 4   Multiple Vitamins-Minerals (MULTIVITAMIN WITH MINERALS) tablet Take 1 tablet by mouth daily.     pantoprazole (PROTONIX) 40 MG tablet TAKE 1 TABLET BY MOUTH ONCE DAILY AS NEEDED FOR ACID REFLUX 90 tablet 3   No current facility-administered  medications for this visit.    Allergies  Allergen Reactions   Latex     Contact dermatitis   Sulfonamide Derivatives     Childhood allergy, kidney problems    Social History   Socioeconomic History   Marital status: Single    Spouse name: Not on file   Number of children: 0   Years of education: Not on file   Highest education level: Not on file  Occupational History   Occupation: Physical therapist  Tobacco Use   Smoking status: Never   Smokeless tobacco: Never  Vaping Use   Vaping Use: Never used  Substance and Sexual Activity   Alcohol use: Yes    Comment: socially   Drug use: No   Sexual activity: Not Currently    Birth  control/protection: Post-menopausal    Comment: BTL  Other Topics Concern   Not on file  Social History Narrative   Not on file   Social Determinants of Health   Financial Resource Strain: Not on file  Food Insecurity: Not on file  Transportation Needs: Not on file  Physical Activity: Not on file  Stress: Not on file  Social Connections: Not on file  Intimate Partner Violence: Not on file    Family History  Problem Relation Age of Onset   Hypertension Mother    Atrial fibrillation Mother    Deep vein thrombosis Mother    Osteoporosis Mother        compression fx   Thyroid disease Mother    Osteoarthritis Mother    Arthritis Mother        shoulder replacement   Stroke Father    Anorexia nervosa Sister    Osteoporosis Sister    Thyroid disease Sister    Diabetes Brother    Cancer Paternal Grandmother        multi-organ    Review of Systems:  As stated in the HPI and otherwise negative.   LMP 01/10/2006   Physical Examination:  General: Well developed, well nourished, NAD  HEENT: OP clear, mucus membranes moist  SKIN: warm, dry. No rashes. Neuro: No focal deficits  Musculoskeletal: Muscle strength 5/5 all ext  Psychiatric: Mood and affect normal  Neck: No JVD, no carotid bruits, no thyromegaly, no lymphadenopathy.  Lungs:Clear bilaterally, no wheezes, rhonci, crackles Cardiovascular: Regular rate and rhythm. No murmurs, gallops or rubs. Abdomen:Soft. Bowel sounds present. Non-tender.  Extremities: No lower extremity edema. Pulses are 2 + in the bilateral DP/PT.  EKG:  EKG is *** ordered today. The ekg ordered today demonstrates  Echo 02/18/22: 1. Left ventricular ejection fraction, by estimation, is 60 to 65%. The  left ventricle has normal function. The left ventricle has no regional  wall motion abnormalities. Left ventricular diastolic parameters were  normal.   2. Right ventricular systolic function is normal. The right ventricular  size is normal.  Tricuspid regurgitation signal is inadequate for assessing  PA pressure.   3. The mitral valve is normal in structure. Mild mitral valve  regurgitation. No evidence of mitral stenosis.   4. The aortic valve is tricuspid. Aortic valve regurgitation is not  visualized. No aortic stenosis is present.   5. The inferior vena cava is normal in size with greater than 50%  respiratory variability, suggesting right atrial pressure of 3 mmHg.   Recent Labs: 01/03/2022: ALT 22; BUN 15; Creatinine, Ser 0.86; Hemoglobin 15.0; Magnesium 2.3; Platelets 316; Potassium 3.6; Sodium 140 02/01/2022: TSH 2.76   Lipid Panel  Component Value Date/Time   CHOL 222 (H) 05/25/2021 1134   CHOL 213 (H) 11/14/2019 0841   TRIG 133.0 05/25/2021 1134   TRIG 81 10/15/2008 0000   HDL 81.40 05/25/2021 1134   HDL 72 11/14/2019 0841   CHOLHDL 3 05/25/2021 1134   VLDL 26.6 05/25/2021 1134   LDLCALC 114 (H) 05/25/2021 1134   LDLCALC 112 (H) 11/26/2020 1640   LDLDIRECT 107.4 05/14/2012 0736     Wt Readings from Last 3 Encounters:  02/01/22 63 kg  01/26/22 63.2 kg  01/03/22 63 kg    Assessment and Plan:   1. SVT: Sinus today. No palpitations. Continue Toprol. Vagal maneuvers are reviewed.   Labs/ tests ordered today include:  No orders of the defined types were placed in this encounter.  Disposition:   F/U with me in one year  Signed, Verne Carrowhristopher Ajee Heasley, MD, [facility] 04/17/2022 11:45 AM    [facility] [address], [place], [place]  [address] Phone: [phone]; Fax: [phone]

## 2022-04-18 ENCOUNTER — Ambulatory Visit: Payer: 59 | Attending: Cardiovascular Disease | Admitting: Cardiovascular Disease

## 2022-04-18 ENCOUNTER — Other Ambulatory Visit (HOSPITAL_COMMUNITY): Payer: Self-pay

## 2022-04-18 ENCOUNTER — Encounter: Payer: Self-pay | Admitting: Cardiovascular Disease

## 2022-04-18 VITALS — BP 118/72 | HR 72 | Ht 62.0 in | Wt 140.6 lb

## 2022-04-18 DIAGNOSIS — I471 Supraventricular tachycardia, unspecified: Secondary | ICD-10-CM

## 2022-04-18 MED ORDER — METOPROLOL SUCCINATE ER 25 MG PO TB24
25.0000 mg | ORAL_TABLET | Freq: Every day | ORAL | 3 refills | Status: DC
  Filled 2022-04-18 – 2022-04-27 (×2): qty 90, 90d supply, fill #0
  Filled 2022-07-23: qty 90, 90d supply, fill #1
  Filled 2022-10-23: qty 90, 90d supply, fill #2
  Filled 2023-01-20: qty 90, 90d supply, fill #3

## 2022-04-18 NOTE — Patient Instructions (Signed)
Medication Instructions:  No changes *If you need a refill on your cardiac medications before your next appointment, please call your pharmacy*   Lab Work: none If you have labs (blood work) drawn today and your tests are completely normal, you will receive your results only by: MyChart Message (if you have MyChart) OR A paper copy in the mail If you have any lab test that is abnormal or we need to change your treatment, we will call you to review the results.   Testing/Procedures: none   Follow-Up: At [facility], you and your health needs are our priority.  As part of our continuing mission to provide you with exceptional heart care, we have created designated Provider Care Teams.  These Care Teams include your primary Cardiologist (physician) and Advanced Practice Providers (APPs -  Physician Assistants and Nurse Practitioners) who all work together to provide you with the care you need, when you need it.   Your next appointment:   12 month(s)  Provider:   Christopher McAlhany, MD      

## 2022-04-26 ENCOUNTER — Other Ambulatory Visit (HOSPITAL_COMMUNITY): Payer: Self-pay

## 2022-04-27 ENCOUNTER — Other Ambulatory Visit (HOSPITAL_COMMUNITY): Payer: Self-pay

## 2022-05-05 DIAGNOSIS — H35372 Puckering of macula, left eye: Secondary | ICD-10-CM | POA: Diagnosis not present

## 2022-05-05 DIAGNOSIS — H524 Presbyopia: Secondary | ICD-10-CM | POA: Diagnosis not present

## 2022-05-27 ENCOUNTER — Encounter: Payer: Self-pay | Admitting: Internal Medicine

## 2022-05-27 ENCOUNTER — Other Ambulatory Visit (HOSPITAL_COMMUNITY): Payer: Self-pay

## 2022-05-27 ENCOUNTER — Ambulatory Visit (INDEPENDENT_AMBULATORY_CARE_PROVIDER_SITE_OTHER): Payer: 59 | Admitting: Internal Medicine

## 2022-05-27 VITALS — BP 100/80 | HR 65 | Temp 98.4°F | Ht 62.0 in | Wt 140.0 lb

## 2022-05-27 DIAGNOSIS — Z13220 Encounter for screening for lipoid disorders: Secondary | ICD-10-CM

## 2022-05-27 DIAGNOSIS — E559 Vitamin D deficiency, unspecified: Secondary | ICD-10-CM

## 2022-05-27 DIAGNOSIS — R7303 Prediabetes: Secondary | ICD-10-CM | POA: Diagnosis not present

## 2022-05-27 DIAGNOSIS — Z0000 Encounter for general adult medical examination without abnormal findings: Secondary | ICD-10-CM

## 2022-05-27 DIAGNOSIS — M810 Age-related osteoporosis without current pathological fracture: Secondary | ICD-10-CM

## 2022-05-27 DIAGNOSIS — K219 Gastro-esophageal reflux disease without esophagitis: Secondary | ICD-10-CM

## 2022-05-27 LAB — COMPREHENSIVE METABOLIC PANEL
ALT: 15 U/L (ref 0–35)
AST: 16 U/L (ref 0–37)
Albumin: 4.1 g/dL (ref 3.5–5.2)
Alkaline Phosphatase: 36 U/L — ABNORMAL LOW (ref 39–117)
BUN: 15 mg/dL (ref 6–23)
CO2: 30 mEq/L (ref 19–32)
Calcium: 9.3 mg/dL (ref 8.4–10.5)
Chloride: 103 mEq/L (ref 96–112)
Creatinine, Ser: 0.83 mg/dL (ref 0.40–1.20)
GFR: 72.83 mL/min (ref >60.00)
Glucose, Bld: 96 mg/dL (ref 70–99)
Potassium: 4.6 mEq/L (ref 3.5–5.1)
Sodium: 140 mEq/L (ref 135–145)
Total Bilirubin: 0.4 mg/dL (ref 0.2–1.2)
Total Protein: 6.9 g/dL (ref 6.0–8.3)

## 2022-05-27 LAB — LIPID PANEL
Cholesterol: 199 mg/dL (ref 0–200)
HDL: 68.3 mg/dL (ref >39.00)
LDL Cholesterol: 108 mg/dL — ABNORMAL HIGH (ref 0–99)
NonHDL: 131.1
Total CHOL/HDL Ratio: 3
Triglycerides: 116 mg/dL (ref 0.0–149.0)
VLDL: 23.2 mg/dL (ref 0.0–40.0)

## 2022-05-27 LAB — CBC
HCT: 41.5 % (ref 36.0–46.0)
Hemoglobin: 14.3 g/dL (ref 12.0–15.0)
MCHC: 34.4 g/dL (ref 30.0–36.0)
MCV: 91.8 fl (ref 78.0–100.0)
Platelets: 225 10*3/uL (ref 150.0–400.0)
RBC: 4.52 Mil/uL (ref 3.87–5.11)
RDW: 13.2 % (ref 11.5–15.5)
WBC: 6.1 10*3/uL (ref 4.0–10.5)

## 2022-05-27 LAB — HEMOGLOBIN A1C: Hgb A1c MFr Bld: 5.6 % (ref 4.6–6.5)

## 2022-05-27 LAB — VITAMIN D 25 HYDROXY (VIT D DEFICIENCY, FRACTURES): VITD: 33.44 ng/mL (ref 30.00–100.00)

## 2022-05-27 MED ORDER — PANTOPRAZOLE SODIUM 40 MG PO TBEC
40.0000 mg | DELAYED_RELEASE_TABLET | Freq: Every day | ORAL | 3 refills | Status: DC | PRN
  Filled 2022-05-27: qty 90, fill #0
  Filled 2022-07-23: qty 90, 90d supply, fill #0
  Filled 2022-10-23: qty 90, 90d supply, fill #1
  Filled 2023-02-08: qty 90, 90d supply, fill #2
  Filled 2023-05-11: qty 90, 90d supply, fill #3

## 2022-05-27 MED ORDER — MECLIZINE HCL 12.5 MG PO TABS
12.5000 mg | ORAL_TABLET | Freq: Three times a day (TID) | ORAL | 0 refills | Status: DC | PRN
  Filled 2022-05-27: qty 30, 10d supply, fill #0

## 2022-05-27 NOTE — Progress Notes (Signed)
   Subjective:   Patient ID: [name], female    DOB: August 24, 1955, 67 y.o.   MRN: [id number]  HPI The patient is here for physical.  PMH, [facility], social history reviewed and updated  Review of Systems  Constitutional: Negative.   HENT: Negative.    Eyes: Negative.   Respiratory:  Negative for cough, chest tightness and shortness of breath.   Cardiovascular:  Negative for chest pain, palpitations and leg swelling.  Gastrointestinal:  Negative for abdominal distention, abdominal pain, constipation, diarrhea, nausea and vomiting.  Musculoskeletal: Negative.   Skin: Negative.   Neurological: Negative.   Psychiatric/Behavioral: Negative.      Objective:  Physical Exam Constitutional:      Appearance: She is well-developed.  HENT:     Head: Normocephalic and atraumatic.  Cardiovascular:     Rate and Rhythm: Normal rate and regular rhythm.  Pulmonary:     Effort: Pulmonary effort is normal. No respiratory distress.     Breath sounds: Normal breath sounds. No wheezing or rales.  Abdominal:     General: Bowel sounds are normal. There is no distension.     Palpations: Abdomen is soft.     Tenderness: There is no abdominal tenderness. There is no rebound.  Musculoskeletal:     Cervical back: Normal range of motion.  Skin:    General: Skin is warm and dry.  Neurological:     Mental Status: She is alert and oriented to person, place, and time.     Coordination: Coordination normal.     Vitals:   05/27/22 0837  BP: 100/80  Pulse: 65  Temp: 98.4 F (36.9 C)  TempSrc: Oral  SpO2: 97%  Weight: 140 lb (63.5 kg)  Height: 5\' 2"  (1.575 m)    Assessment & Plan:

## 2022-05-27 NOTE — Assessment & Plan Note (Signed)
Flu shot yearly. Pneumonia complete. Shingrix complete. Tetanus up to date. Colonoscopy up to date. Mammogram up to date, pap smear aged out and dexa up to date. Counseled about sun safety and mole surveillance. Counseled about the dangers of distracted driving. Given 10 year screening recommendations.   

## 2022-05-27 NOTE — Assessment & Plan Note (Signed)
Checking vitamin D and adjust as needed.  

## 2022-05-27 NOTE — Assessment & Plan Note (Signed)
DEXA due 2025 and she does not want treatment at this time.

## 2022-05-27 NOTE — Assessment & Plan Note (Signed)
Taking protonix 40 mg daily and controlled. Continue.

## 2022-05-27 NOTE — Assessment & Plan Note (Signed)
Checking HgA1c for follow up.  

## 2022-06-03 ENCOUNTER — Other Ambulatory Visit (HOSPITAL_COMMUNITY): Payer: Self-pay

## 2022-07-23 ENCOUNTER — Other Ambulatory Visit (HOSPITAL_COMMUNITY): Payer: Self-pay

## 2022-07-25 ENCOUNTER — Other Ambulatory Visit: Payer: Self-pay

## 2022-07-29 ENCOUNTER — Telehealth: Payer: 59 | Admitting: Nurse Practitioner

## 2022-07-29 ENCOUNTER — Other Ambulatory Visit (HOSPITAL_COMMUNITY): Payer: Self-pay

## 2022-07-29 DIAGNOSIS — J0110 Acute frontal sinusitis, unspecified: Secondary | ICD-10-CM | POA: Diagnosis not present

## 2022-07-29 MED ORDER — FLUTICASONE PROPIONATE 50 MCG/ACT NA SUSP
2.0000 | Freq: Every day | NASAL | 6 refills | Status: DC
  Filled 2022-07-29: qty 16, 30d supply, fill #0

## 2022-07-29 MED ORDER — AMOXICILLIN-POT CLAVULANATE 875-125 MG PO TABS
1.0000 | ORAL_TABLET | Freq: Two times a day (BID) | ORAL | 0 refills | Status: AC
  Filled 2022-07-29: qty 14, 7d supply, fill #0

## 2022-07-29 NOTE — Progress Notes (Signed)
E-Visit for Sinus Problems  We are sorry that you are not feeling well.  Here is how we plan to help!  Based on what you have shared with me it looks like you have sinusitis.  Sinusitis is inflammation and infection in the sinus cavities of the head.  Based on your presentation I believe you most likely have Acute Bacterial Sinusitis.  This is an infection caused by bacteria and is treated with antibiotics. I have prescribed Augmentin 875mg /125mg  one tablet twice daily with food, for 7 days. We will also prescribe flonase to help in management of your symptoms You may use an oral decongestant such as Mucinex D or if you have glaucoma or high blood pressure use plain Mucinex. Saline nasal spray help and can safely be used as often as needed for congestion.  If you develop worsening sinus pain, fever or notice severe headache and vision changes, or if symptoms are not better after completion of antibiotic, please schedule an appointment with a health care provider.    Sinus infections are not as easily transmitted as other respiratory infection, however we still recommend that you avoid close contact with loved ones, especially the very young and elderly.  Remember to wash your hands thoroughly throughout the day as this is the number one way to prevent the spread of infection!  Home Care: Only take medications as instructed by your medical team. Complete the entire course of an antibiotic. Do not take these medications with alcohol. A steam or ultrasonic humidifier can help congestion.  You can place a towel over your head and breathe in the steam from hot water coming from a faucet. Avoid close contacts especially the very young and the elderly. Cover your mouth when you cough or sneeze. Always remember to wash your hands.  Get Help Right Away If: You develop worsening fever or sinus pain. You develop a severe head ache or visual changes. Your symptoms persist after you have completed your  treatment plan.  Make sure you Understand these instructions. Will watch your condition. Will get help right away if you are not doing well or get worse.  Thank you for choosing an e-visit.  Your e-visit answers were reviewed by a board certified advanced clinical practitioner to complete your personal care plan. Depending upon the condition, your plan could have included both over the counter or prescription medications.  Please review your pharmacy choice. Make sure the pharmacy is open so you can pick up prescription now. If there is a problem, you may contact your provider through [employer] and have the prescription routed to another pharmacy.  Your safety is important to [place]. If you have drug allergies check your prescription carefully.   For the next 24 hours you can use MyChart to ask questions about today's visit, request a non-urgent call back, or ask for a work or school excuse. You will get an email in the next two days asking about your experience. I hope that your e-visit has been valuable and will speed your recovery.   Meds ordered this encounter  Medications   amoxicillin-clavulanate (AUGMENTIN) 875-125 MG tablet    Sig: Take 1 tablet by mouth 2 (two) times daily for 7 days.    Dispense:  14 tablet    Refill:  0   fluticasone (FLONASE) 50 MCG/ACT nasal spray    Sig: Place 2 sprays into both nostrils daily.    Dispense:  16 g    Refill:  6  I spent approximately 5 minutes reviewing the patient's history, current symptoms and coordinating their care today.

## 2022-08-01 ENCOUNTER — Other Ambulatory Visit (HOSPITAL_COMMUNITY): Payer: Self-pay

## 2022-08-01 DIAGNOSIS — B0052 Herpesviral keratitis: Secondary | ICD-10-CM | POA: Diagnosis not present

## 2022-08-01 MED ORDER — VALACYCLOVIR HCL 1 G PO TABS
1000.0000 mg | ORAL_TABLET | Freq: Three times a day (TID) | ORAL | 1 refills | Status: DC
  Filled 2022-08-01: qty 40, 14d supply, fill #0
  Filled 2022-08-16: qty 40, 14d supply, fill #1

## 2022-08-05 DIAGNOSIS — B0052 Herpesviral keratitis: Secondary | ICD-10-CM | POA: Diagnosis not present

## 2022-08-12 DIAGNOSIS — B0052 Herpesviral keratitis: Secondary | ICD-10-CM | POA: Diagnosis not present

## 2022-09-09 DIAGNOSIS — H1789 Other corneal scars and opacities: Secondary | ICD-10-CM | POA: Diagnosis not present

## 2022-10-27 ENCOUNTER — Other Ambulatory Visit (HOSPITAL_COMMUNITY): Payer: Self-pay

## 2022-10-27 MED ORDER — COVID-19 MRNA VAC-TRIS(PFIZER) 30 MCG/0.3ML IM SUSY
0.3000 mL | PREFILLED_SYRINGE | INTRAMUSCULAR | 0 refills | Status: AC
  Filled 2022-10-27: qty 0.3, 1d supply, fill #0

## 2022-10-31 IMAGING — MG MM DIGITAL SCREENING BILAT W/ TOMO AND CAD
8 series · 9 of 24 positions shown · non-contrast
Comparison: Previous exam(s).

CLINICAL DATA: Screening.

EXAM:
DIGITAL SCREENING BILATERAL MAMMOGRAM WITH TOMOSYNTHESIS AND CAD
TECHNIQUE: Bilateral screening digital craniocaudal and mediolateral oblique
mammograms were obtained. Bilateral screening digital breast
tomosynthesis was performed. The images were evaluated with
computer-aided detection.

[L CC synth-2D]
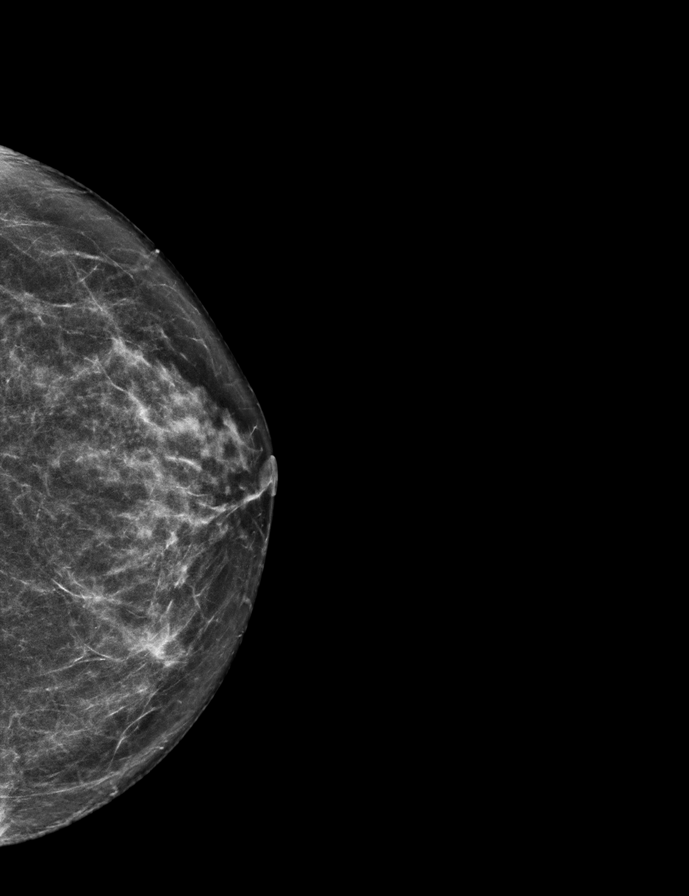

[L MLO synth-2D]
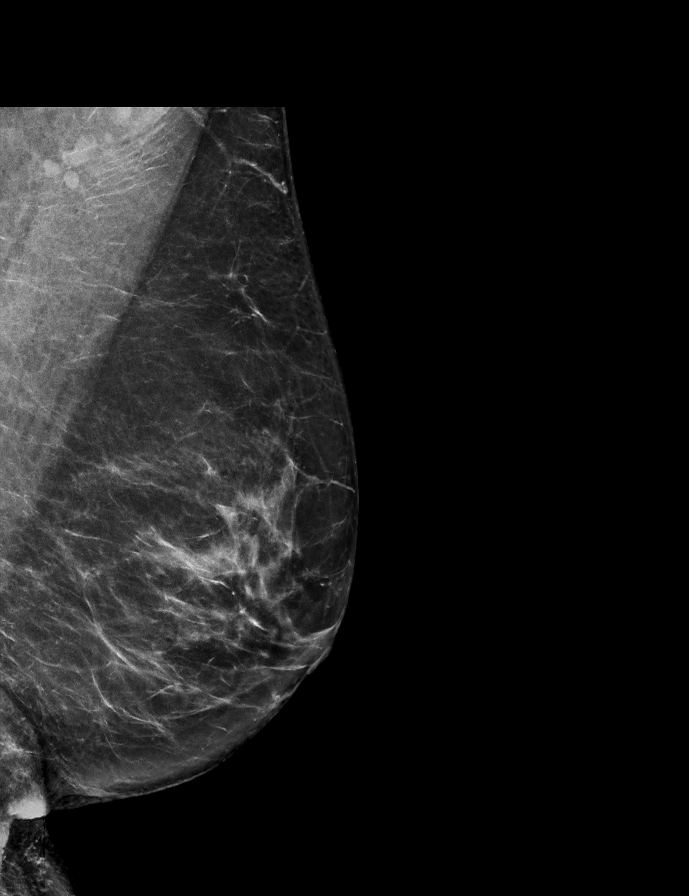

[R CC synth-2D]
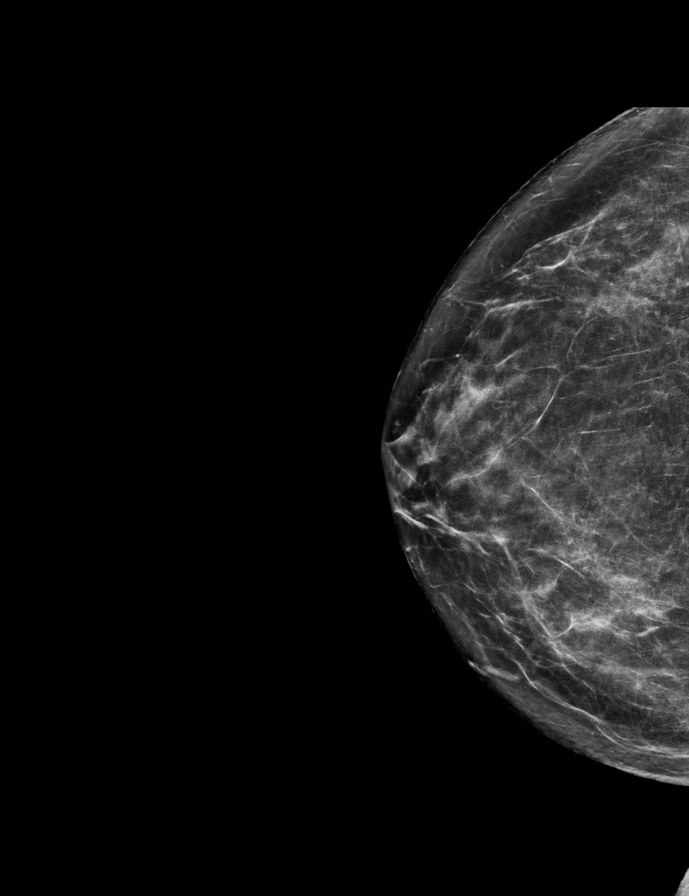

[R MLO synth-2D]
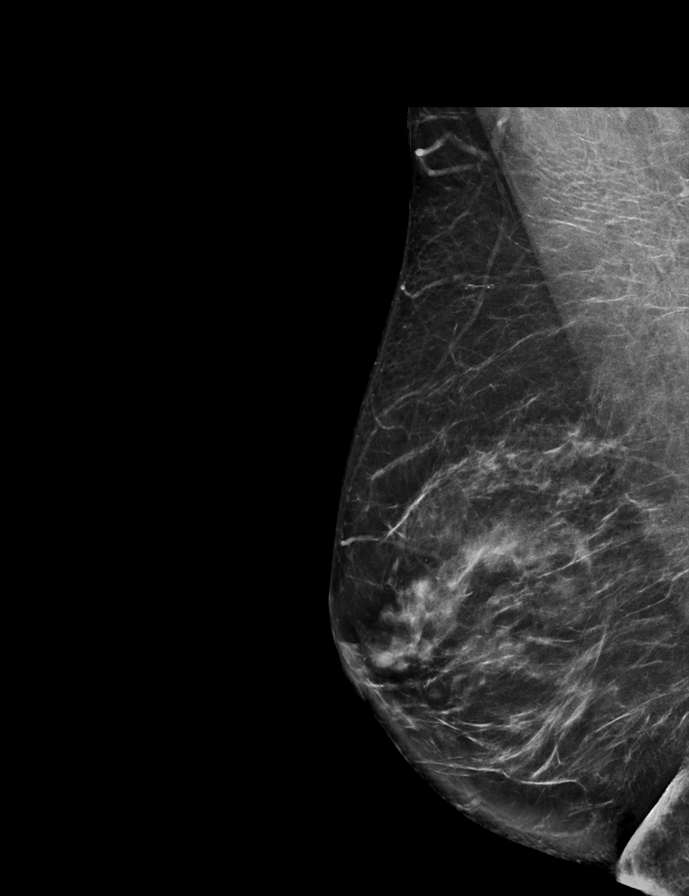

[R MLO tomo · 2 of 71 frames shown]
[frame 23/71]
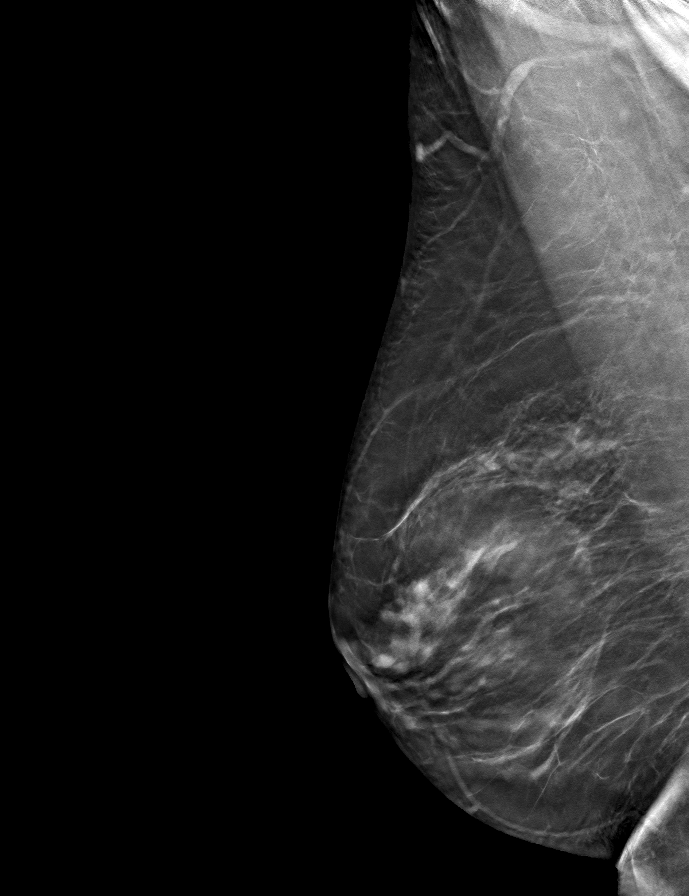
[frame 36/71]
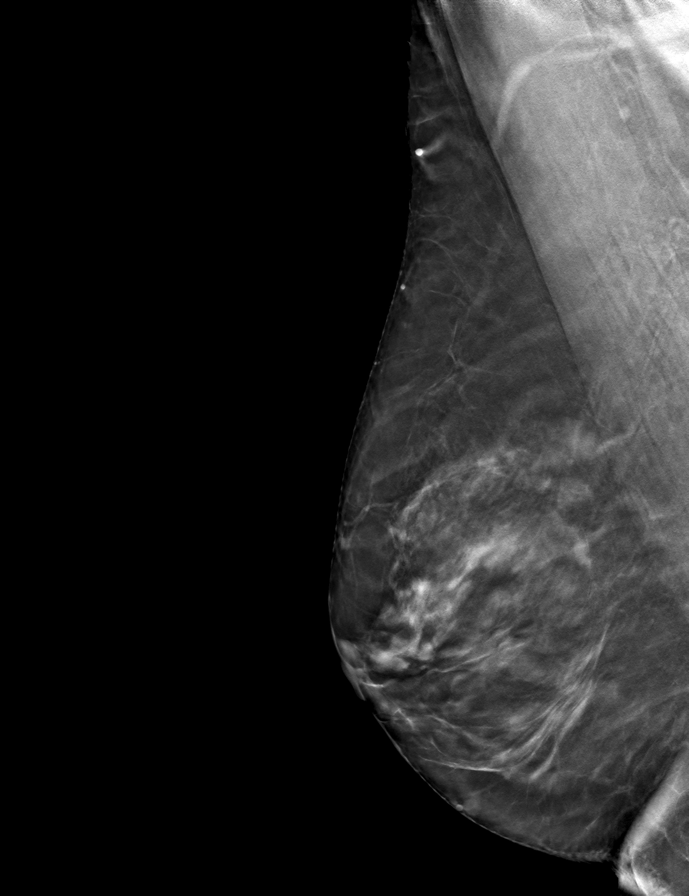

[L MLO tomo · tomo slice 37/74.0]
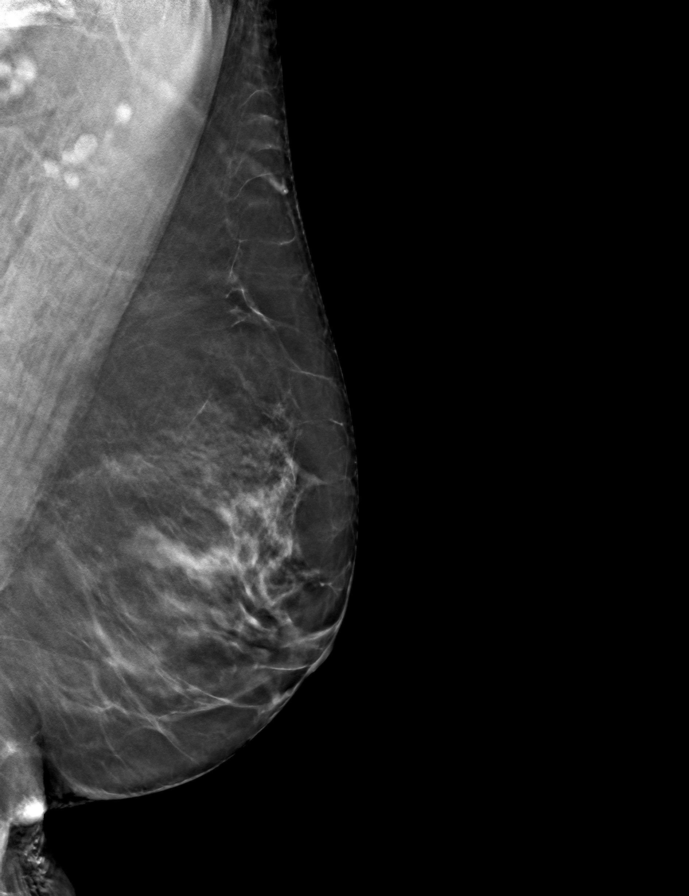

[L CC tomo · tomo slice 31/61.0]
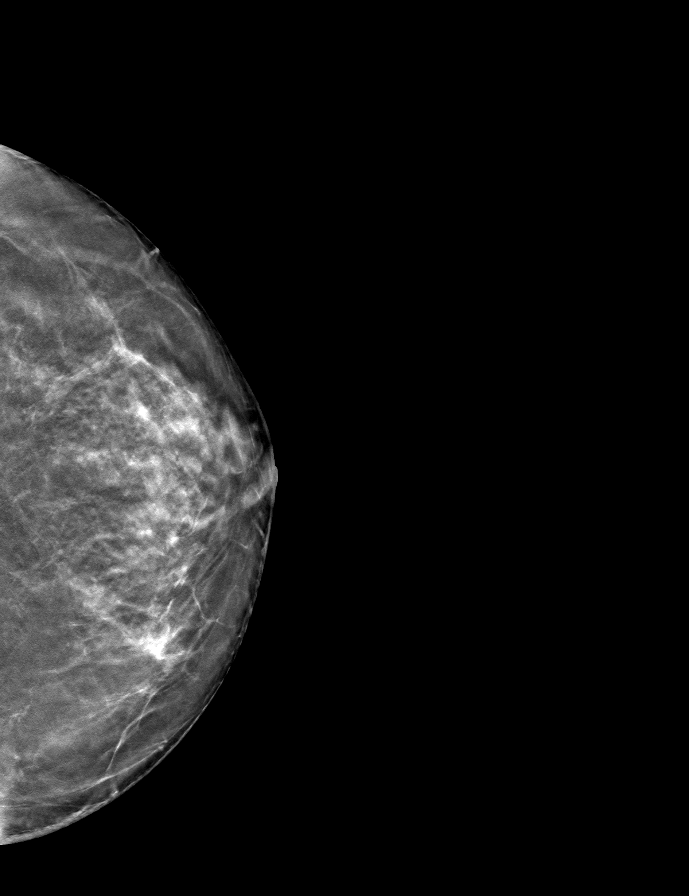

[R CC tomo · tomo slice 33/65.0]
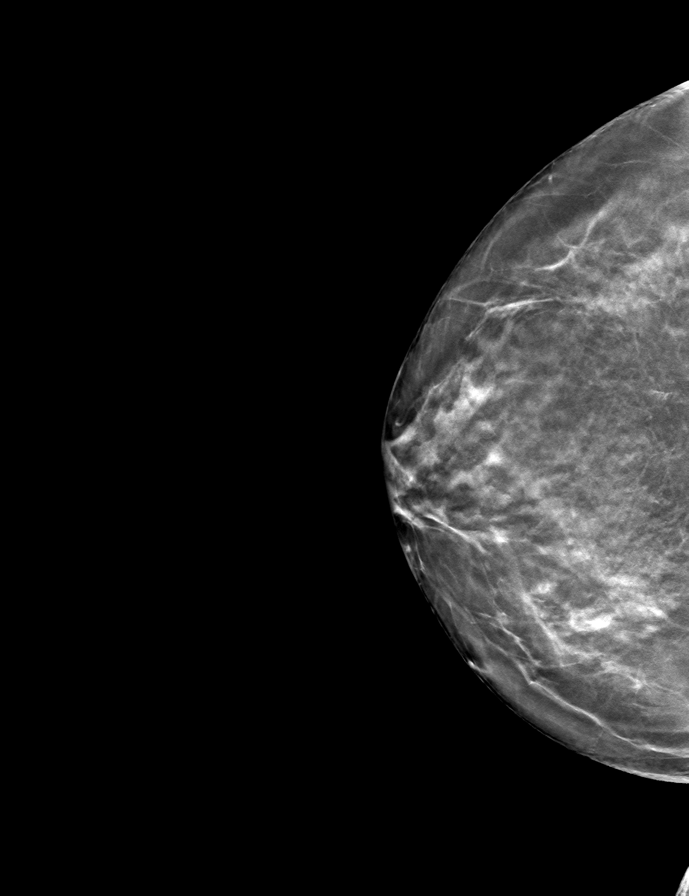

[9 of 24 positions shown; findings below may reference images not displayed]

ACR Breast Density Category b: There are scattered areas of
fibroglandular density.
FINDINGS: There are no findings suspicious for malignancy.
IMPRESSION: No mammographic evidence of malignancy. A result letter of this
screening mammogram will be mailed directly to the patient.

RECOMMENDATION:
Screening mammogram in one year. (Code:51-O-LD2)

BI-RADS CATEGORY  1: Negative.

## 2023-01-20 ENCOUNTER — Other Ambulatory Visit: Payer: Self-pay | Admitting: Internal Medicine

## 2023-01-20 DIAGNOSIS — Z1231 Encounter for screening mammogram for malignant neoplasm of breast: Secondary | ICD-10-CM

## 2023-01-23 ENCOUNTER — Other Ambulatory Visit (HOSPITAL_COMMUNITY): Payer: Self-pay

## 2023-02-02 ENCOUNTER — Ambulatory Visit: Payer: 59

## 2023-02-08 ENCOUNTER — Other Ambulatory Visit (HOSPITAL_COMMUNITY): Payer: Self-pay

## 2023-02-17 ENCOUNTER — Ambulatory Visit
Admission: RE | Admit: 2023-02-17 | Discharge: 2023-02-17 | Disposition: A | Discharge status: Home / Self Care | Payer: 59 | Source: Ambulatory Visit | Attending: Internal Medicine | Admitting: Internal Medicine

## 2023-02-17 DIAGNOSIS — Z1231 Encounter for screening mammogram for malignant neoplasm of breast: Secondary | ICD-10-CM | POA: Diagnosis not present

## 2023-02-22 ENCOUNTER — Other Ambulatory Visit: Payer: Self-pay | Admitting: Internal Medicine

## 2023-02-22 DIAGNOSIS — R928 Other abnormal and inconclusive findings on diagnostic imaging of breast: Secondary | ICD-10-CM

## 2023-03-24 ENCOUNTER — Ambulatory Visit: Payer: 59

## 2023-03-24 ENCOUNTER — Ambulatory Visit
Admission: RE | Admit: 2023-03-24 | Discharge: 2023-03-24 | Disposition: A | Discharge status: Home / Self Care | Payer: 59 | Source: Ambulatory Visit | Attending: Internal Medicine | Admitting: Internal Medicine

## 2023-03-24 DIAGNOSIS — R928 Other abnormal and inconclusive findings on diagnostic imaging of breast: Secondary | ICD-10-CM

## 2023-04-27 ENCOUNTER — Other Ambulatory Visit (HOSPITAL_COMMUNITY): Payer: Self-pay

## 2023-04-27 ENCOUNTER — Other Ambulatory Visit: Payer: Self-pay | Admitting: Cardiovascular Disease

## 2023-04-27 MED ORDER — METOPROLOL SUCCINATE ER 25 MG PO TB24
25.0000 mg | ORAL_TABLET | Freq: Every day | ORAL | 0 refills | Status: DC
  Filled 2023-04-27: qty 90, 90d supply, fill #0

## 2023-05-11 DIAGNOSIS — H5203 Hypermetropia, bilateral: Secondary | ICD-10-CM | POA: Diagnosis not present

## 2023-05-11 DIAGNOSIS — H524 Presbyopia: Secondary | ICD-10-CM | POA: Diagnosis not present

## 2023-05-11 DIAGNOSIS — H35373 Puckering of macula, bilateral: Secondary | ICD-10-CM | POA: Diagnosis not present

## 2023-05-22 ENCOUNTER — Ambulatory Visit: Payer: 59 | Attending: Cardiovascular Disease | Admitting: Cardiovascular Disease

## 2023-05-22 ENCOUNTER — Encounter: Payer: Self-pay | Admitting: Cardiovascular Disease

## 2023-05-22 VITALS — BP 134/76 | HR 64 | Ht 62.0 in | Wt 141.6 lb

## 2023-05-22 DIAGNOSIS — I471 Supraventricular tachycardia, unspecified: Secondary | ICD-10-CM | POA: Diagnosis not present

## 2023-05-22 NOTE — Progress Notes (Signed)
 Chief Complaint  Patient presents with   Follow-up    SVT   History of Present Illness: 68 yo female with history of GERD, hiatal hernia, Meniere's disease and SVT who is here today for cardiac follow up. I saw her as a new patient in January 2024 for the evaluation of SVT. She was seen in the ED at [facility] on 01/03/22 with palpitations and was found to have SVT. She converted to sinus with IV adenosine . She had been recovering from a viral illness and had consumed alcohol on the night prior to her episode of SVT. She was discharged home with metoprolol  BID. When I saw her in January 2024 she was feeling well. Her metoprolol  was changed to Toprol . Echo February 2024 with LVEF=60-65%. Mild mitral regurgitation.   She is here today for follow up. The patient denies any chest pain, dyspnea, palpitations, lower extremity edema, orthopnea, PND, dizziness, near syncope or syncope.   Primary Care Physician: Adelia Homestead, MD   Past Medical History:  Diagnosis Date   GERD (gastroesophageal reflux disease)    takes Omeprazole and Protonix  daily   H/O hiatal hernia    History of bronchitis    15+yrs ago   History of colon polyps    History of vertigo    takes Antivert  daily as needed   Joint pain    Meniere's disease    L  hearing loss related to same   Osteoporosis    PMB (postmenopausal bleeding) 12/07   proliferative endo. No HRT, PUS normal    Past Surgical History:  Procedure Laterality Date   ABSCESS DRAINAGE  09/2010   suprapubic   BTSP  1994   CATARACT EXTRACTION EXTRACAPSULAR Right 10/2014   COLONOSCOPY W/ BIOPSIES  12/12/05   hyperplastic polyp, recheck in 10 years   CYSTECTOMY     from buttock   GAS/FLUID EXCHANGE Right 12/23/2013   Procedure: GAS/FLUID EXCHANGE;  Surgeon: Daria Eddy, MD;  Location: [facility] OR;  Service: Ophthalmology;  Laterality: Right;  SF6   IRRIGATION AND DEBRIDEMENT SEBACEOUS CYST  1980 & 1985   MEMBRANE PEEL Right 12/23/2013   Procedure:  MEMBRANE PEEL;  Surgeon: Daria Eddy, MD;  Location: [facility] OR;  Service: Ophthalmology;  Laterality: Right;   PARS PLANA VITRECTOMY Right 10/07/2013   Procedure: PARS PLANA VITRECTOMY WITH 25 GAUGE RIGHT EYE;  Surgeon: Daria Eddy, MD;  Location: [facility] OR;  Service: Ophthalmology;  Laterality: Right;   PARS PLANA VITRECTOMY Right 12/23/2013   Procedure: PARS PLANA VITRECTOMY WITH 25 GAUGE;  Surgeon: Daria Eddy, MD;  Location: [facility] OR;  Service: Ophthalmology;  Laterality: Right;   TUBAL LIGATION      Current Outpatient Medications  Medication Sig Dispense Refill   fluticasone  (FLONASE ) 50 MCG/ACT nasal spray Place 2 sprays into both nostrils daily. 16 g 6   meclizine  (ANTIVERT ) 12.5 MG tablet Take 1 tablet (12.5 mg total) by mouth 3 (three) times daily as needed for dizziness. 30 tablet 0   metoprolol  succinate (TOPROL  XL) 25 MG 24 hr tablet Take 1 tablet (25 mg total) by mouth daily (stop lopressor ). Pt must keep upcoming appt with Cardiology in May 2025 for any more refills. 90 tablet 0   metroNIDAZOLE  (METROGEL ) 0.75 % gel Apply a small amount to face twice a day for rosacea 45 g 4   Multiple Vitamins-Minerals (MULTIVITAMIN WITH MINERALS) tablet Take 1 tablet by mouth daily.     pantoprazole  (PROTONIX ) 40 MG tablet Take  1 tablet (40 mg total) by mouth daily as needed for acid reflux 90 tablet 3   benzonatate  (TESSALON ) 200 MG capsule Take 1 capsule (200 mg total) by mouth 3 (three) times daily as needed. 60 capsule 0   valACYclovir  (VALTREX ) 1000 MG tablet Take 1 tablet by mouth 3 times daily. 40 tablet 1   No current facility-administered medications for this visit.    Allergies  Allergen Reactions   Latex     Contact dermatitis   Sulfonamide Derivatives     Childhood allergy, kidney problems    Social History   Socioeconomic History   Marital status: Single    Spouse name: Not on file   Number of children: 0   Years of education: Not on file   Highest education level:  Not on file  Occupational History   Occupation: Physical therapist  Tobacco Use   Smoking status: Never   Smokeless tobacco: Never  Vaping Use   Vaping status: Never Used  Substance and Sexual Activity   Alcohol use: Yes    Comment: socially   Drug use: No   Sexual activity: Not Currently    Birth control/protection: Post-menopausal    Comment: BTL  Other Topics Concern   Not on file  Social History Narrative   Not on file   Social Drivers of Health   Financial Resource Strain: Not on file  Food Insecurity: Not on file  Transportation Needs: Not on file  Physical Activity: Not on file  Stress: Not on file  Social Connections: Not on file  Intimate Partner Violence: Not on file    Family History  Problem Relation Age of Onset   Hypertension Mother    Atrial fibrillation Mother    Deep vein thrombosis Mother    Osteoporosis Mother        compression fx   Thyroid  disease Mother    Osteoarthritis Mother    Arthritis Mother        shoulder replacement   Stroke Father    Anorexia nervosa Sister    Osteoporosis Sister    Thyroid  disease Sister    Diabetes Brother    Cancer Paternal Grandmother        multi-organ    Review of Systems:  As stated in the HPI and otherwise negative.   BP 134/76   Pulse 64   Ht 5\' 2"  (1.575 m)   Wt 64.2 kg   LMP 01/10/2006   SpO2 97%   BMI 25.90 kg/m   Physical Examination: General: Well developed, well nourished, NAD  HEENT: OP clear, mucus membranes moist  SKIN: warm, dry. No rashes. Neuro: No focal deficits  Musculoskeletal: Muscle strength 5/5 all ext  Psychiatric: Mood and affect normal  Neck: No JVD, no carotid bruits, no thyromegaly, no lymphadenopathy.  Lungs:Clear bilaterally, no wheezes, rhonci, crackles Cardiovascular: Regular rate and rhythm. No murmurs, gallops or rubs. Abdomen:Soft. Bowel sounds present. Non-tender.  Extremities: No lower extremity edema. Pulses are 2 + in the bilateral DP/PT.  EKG:  EKG is  ordered today. The ekg ordered today demonstrates EKG Interpretation Date/Time:  Monday May 22 2023 08:27:28 EDT Ventricular Rate:  59 PR Interval:  158 QRS Duration:  84 QT Interval:  396 QTC Calculation: 392 R Axis:   32  Text Interpretation: Sinus bradycardia No previous ECGs available Confirmed by Antoinette Batman [phone]) on 05/22/2023 8:29:59 AM    Recent Labs: 05/27/2022: ALT 15; BUN 15; Creatinine, Ser 0.83; Hemoglobin 14.3; Platelets 225.0; Potassium 4.6;  Sodium 140   Lipid Panel    Component Value Date/Time   CHOL 199 05/27/2022 0919   CHOL 213 (H) 11/14/2019 0841   TRIG 116.0 05/27/2022 0919   TRIG 81 10/15/2008 0000   HDL 68.30 05/27/2022 0919   HDL 72 11/14/2019 0841   CHOLHDL 3 05/27/2022 0919   VLDL 23.2 05/27/2022 0919   LDLCALC 108 (H) 05/27/2022 0919   LDLCALC 112 (H) 11/26/2020 1640   LDLDIRECT 107.4 05/14/2012 0736     Wt Readings from Last 3 Encounters:  05/22/23 64.2 kg  05/27/22 63.5 kg  04/18/22 63.8 kg    Assessment and Plan:   1. SVT: No palpitations. Sinus today. Continue Toprol . Vagal maneuvers are reviewed.   2. Mitral regurgitation: Mild by echo 2024. No loud murmur on exam.   Labs/ tests ordered today include:   Orders Placed This Encounter  Procedures   EKG 12-Lead   Disposition:   F/U with me in one year  Signed, Antoinette Batman, MD, [facility] 05/22/2023 9:05 AM    [facility] [address] [place], [place], [place]  [address] Phone: [phone]; Fax: [phone]

## 2023-05-22 NOTE — Patient Instructions (Signed)
 Medication Instructions:  No changes *If you need a refill on your cardiac medications before your next appointment, please call your pharmacy*  Lab Work: none If you have labs (blood work) drawn today and your tests are completely normal, you will receive your results only by: MyChart Message (if you have MyChart) OR A paper copy in the mail If you have any lab test that is abnormal or we need to change your treatment, we will call you to review the results.  Testing/Procedures: none  Follow-Up: At [facility], you and your health needs are our priority.  As part of our continuing mission to provide you with exceptional heart care, our providers are all part of one team.  This team includes your primary Cardiologist (physician) and Advanced Practice Providers or APPs (Physician Assistants and Nurse Practitioners) who all work together to provide you with the care you need, when you need it.  Your next appointment:   12 month(s)  Provider:   Antoinette Batman, MD     Other Instructions

## 2023-07-25 ENCOUNTER — Other Ambulatory Visit (HOSPITAL_COMMUNITY): Payer: Self-pay

## 2023-07-25 ENCOUNTER — Other Ambulatory Visit (HOSPITAL_BASED_OUTPATIENT_CLINIC_OR_DEPARTMENT_OTHER): Payer: Self-pay

## 2023-07-25 ENCOUNTER — Other Ambulatory Visit: Payer: Self-pay | Admitting: Cardiovascular Disease

## 2023-07-25 MED ORDER — METOPROLOL SUCCINATE ER 25 MG PO TB24
25.0000 mg | ORAL_TABLET | Freq: Every day | ORAL | 2 refills | Status: DC
  Filled 2023-07-25: qty 90, 90d supply, fill #0

## 2023-07-25 MED ORDER — METOPROLOL SUCCINATE ER 25 MG PO TB24
25.0000 mg | ORAL_TABLET | Freq: Every day | ORAL | 2 refills | Status: AC
  Filled 2023-07-25 – 2023-08-07 (×2): qty 90, 90d supply, fill #0
  Filled 2023-10-31: qty 90, 90d supply, fill #1
  Filled 2024-01-29: qty 90, 90d supply, fill #2

## 2023-07-27 DIAGNOSIS — H26491 Other secondary cataract, right eye: Secondary | ICD-10-CM | POA: Diagnosis not present

## 2023-07-28 ENCOUNTER — Encounter: Payer: Self-pay | Admitting: Advanced Practice Midwife

## 2023-08-04 ENCOUNTER — Other Ambulatory Visit (HOSPITAL_COMMUNITY): Payer: Self-pay

## 2023-08-07 ENCOUNTER — Other Ambulatory Visit (HOSPITAL_COMMUNITY): Payer: Self-pay

## 2023-08-07 ENCOUNTER — Other Ambulatory Visit: Payer: Self-pay | Admitting: Internal Medicine

## 2023-08-07 ENCOUNTER — Other Ambulatory Visit: Payer: Self-pay

## 2023-08-08 ENCOUNTER — Encounter: Payer: Self-pay | Admitting: Internal Medicine

## 2023-08-16 ENCOUNTER — Other Ambulatory Visit (HOSPITAL_COMMUNITY): Payer: Self-pay

## 2023-08-16 ENCOUNTER — Other Ambulatory Visit: Payer: Self-pay | Admitting: Internal Medicine

## 2023-08-16 MED ORDER — PANTOPRAZOLE SODIUM 40 MG PO TBEC
40.0000 mg | DELAYED_RELEASE_TABLET | Freq: Every day | ORAL | 0 refills | Status: DC | PRN
  Filled 2023-08-16: qty 30, 30d supply, fill #0

## 2023-08-25 ENCOUNTER — Other Ambulatory Visit: Payer: Self-pay

## 2023-08-25 ENCOUNTER — Other Ambulatory Visit (HOSPITAL_COMMUNITY): Payer: Self-pay

## 2023-08-25 ENCOUNTER — Encounter: Payer: Self-pay | Admitting: Internal Medicine

## 2023-08-25 ENCOUNTER — Ambulatory Visit (INDEPENDENT_AMBULATORY_CARE_PROVIDER_SITE_OTHER): Admitting: Internal Medicine

## 2023-08-25 VITALS — BP 118/84 | HR 63 | Temp 98.5°F | Ht 62.0 in | Wt 135.0 lb

## 2023-08-25 DIAGNOSIS — E559 Vitamin D deficiency, unspecified: Secondary | ICD-10-CM

## 2023-08-25 DIAGNOSIS — H9191 Unspecified hearing loss, right ear: Secondary | ICD-10-CM

## 2023-08-25 DIAGNOSIS — R7303 Prediabetes: Secondary | ICD-10-CM | POA: Diagnosis not present

## 2023-08-25 DIAGNOSIS — K219 Gastro-esophageal reflux disease without esophagitis: Secondary | ICD-10-CM

## 2023-08-25 DIAGNOSIS — M810 Age-related osteoporosis without current pathological fracture: Secondary | ICD-10-CM

## 2023-08-25 DIAGNOSIS — H9311 Tinnitus, right ear: Secondary | ICD-10-CM | POA: Diagnosis not present

## 2023-08-25 DIAGNOSIS — Z0000 Encounter for general adult medical examination without abnormal findings: Secondary | ICD-10-CM

## 2023-08-25 DIAGNOSIS — Z0001 Encounter for general adult medical examination with abnormal findings: Secondary | ICD-10-CM | POA: Diagnosis not present

## 2023-08-25 DIAGNOSIS — I471 Supraventricular tachycardia, unspecified: Secondary | ICD-10-CM | POA: Diagnosis not present

## 2023-08-25 LAB — CBC
HCT: 40.7 % (ref 36.0–46.0)
Hemoglobin: 14 g/dL (ref 12.0–15.0)
MCHC: 34.3 g/dL (ref 30.0–36.0)
MCV: 93.2 fl (ref 78.0–100.0)
Platelets: 203 K/uL (ref 150.0–400.0)
RBC: 4.37 Mil/uL (ref 3.87–5.11)
RDW: 12.5 % (ref 11.5–15.5)
WBC: 6.3 K/uL (ref 4.0–10.5)

## 2023-08-25 LAB — HEMOGLOBIN A1C: Hgb A1c MFr Bld: 5.7 % (ref 4.6–6.5)

## 2023-08-25 LAB — LIPID PANEL
Cholesterol: 242 mg/dL — ABNORMAL HIGH (ref 0–200)
HDL: 71.4 mg/dL (ref >39.00)
LDL Cholesterol: 139 mg/dL — ABNORMAL HIGH (ref 0–99)
NonHDL: 170.11
Total CHOL/HDL Ratio: 3
Triglycerides: 154 mg/dL — ABNORMAL HIGH (ref 0.0–149.0)
VLDL: 30.8 mg/dL (ref 0.0–40.0)

## 2023-08-25 LAB — COMPREHENSIVE METABOLIC PANEL WITH GFR
ALT: 17 U/L (ref 0–35)
AST: 21 U/L (ref 0–37)
Albumin: 4.3 g/dL (ref 3.5–5.2)
Alkaline Phosphatase: 38 U/L — ABNORMAL LOW (ref 39–117)
BUN: 16 mg/dL (ref 6–23)
CO2: 28 meq/L (ref 19–32)
Calcium: 8.8 mg/dL (ref 8.4–10.5)
Chloride: 103 meq/L (ref 96–112)
Creatinine, Ser: 0.81 mg/dL (ref 0.40–1.20)
GFR: 74.34 mL/min (ref >60.00)
Glucose, Bld: 91 mg/dL (ref 70–99)
Potassium: 4.2 meq/L (ref 3.5–5.1)
Sodium: 139 meq/L (ref 135–145)
Total Bilirubin: 0.6 mg/dL (ref 0.2–1.2)
Total Protein: 7.1 g/dL (ref 6.0–8.3)

## 2023-08-25 LAB — VITAMIN D 25 HYDROXY (VIT D DEFICIENCY, FRACTURES): VITD: 33.99 ng/mL (ref 30.00–100.00)

## 2023-08-25 MED ORDER — MECLIZINE HCL 12.5 MG PO TABS
12.5000 mg | ORAL_TABLET | Freq: Three times a day (TID) | ORAL | 3 refills | Status: AC | PRN
  Filled 2023-08-25: qty 90, 30d supply, fill #0

## 2023-08-25 MED ORDER — PANTOPRAZOLE SODIUM 40 MG PO TBEC
40.0000 mg | DELAYED_RELEASE_TABLET | Freq: Every day | ORAL | 3 refills | Status: AC | PRN
  Filled 2023-08-25 – 2023-09-12 (×2): qty 90, 90d supply, fill #0
  Filled 2023-12-19: qty 90, 90d supply, fill #1

## 2023-08-25 NOTE — Assessment & Plan Note (Signed)
 DEXA up to date and declines treatment.

## 2023-08-25 NOTE — Assessment & Plan Note (Signed)
 Checking HgA1c and adjust as needed.

## 2023-08-25 NOTE — Progress Notes (Signed)
   Subjective:   Patient ID: [name], female    DOB: 1955/07/14, 68 y.o.   MRN: [id number]  The patient is here for physical. Pertinent topics discussed: Discussed the use of AI scribe software for clinical note transcription with the patient, who gave verbal consent to proceed.   History of Present Illness [name] is a 68 year old female who presents with new onset hearing loss in the right ear.  She describes the hearing loss in her right ear as feeling like 'water  in my ear,' which began approximately three weeks ago. An audiologist evaluated her on August 07, 2023, and found no visible wax or infection. She has tried antihistamines and recently started using Flonase , but the symptoms persist. The hearing loss has significantly impacted her ability to hear patients at work. She describes a 'boing, boing, boing' sensation when attempting to pop her ear and has a history of tinnitus in the left ear, which has now become her better ear. Her mother experienced complete hearing loss, which concerns her.  She denies any new chest pain, tightness, pressure, or episodes of supraventricular tachycardia. Occasionally, she experiences a few irregular heartbeats but notes they do not persist. She has a history of a heart murmur.  PMH, [facility], social history reviewed and updated  Review of Systems  Constitutional: Negative.   HENT: Negative.    Eyes: Negative.   Respiratory:  Negative for cough, chest tightness and shortness of breath.   Cardiovascular:  Negative for chest pain, palpitations and leg swelling.  Gastrointestinal:  Negative for abdominal distention, abdominal pain, constipation, diarrhea, nausea and vomiting.  Musculoskeletal: Negative.   Skin: Negative.   Neurological: Negative.   Psychiatric/Behavioral: Negative.      Objective:  Physical Exam Constitutional:      Appearance: She is well-developed.  HENT:     Head: Normocephalic and atraumatic.  Cardiovascular:     Rate  and Rhythm: Normal rate and regular rhythm.  Pulmonary:     Effort: Pulmonary effort is normal. No respiratory distress.     Breath sounds: Normal breath sounds. No wheezing or rales.  Abdominal:     General: Bowel sounds are normal. There is no distension.     Palpations: Abdomen is soft.     Tenderness: There is no abdominal tenderness. There is no rebound.  Musculoskeletal:     Cervical back: Normal range of motion.  Skin:    General: Skin is warm and dry.  Neurological:     Mental Status: She is alert and oriented to person, place, and time.     Coordination: Coordination normal.     Vitals:   08/25/23 0733  BP: 118/84  Pulse: 63  Temp: 98.5 F (36.9 C)  TempSrc: Oral  SpO2: 98%  Weight: 135 lb (61.2 kg)  Height: 5' 2 (1.575 m)    Assessment & Plan:

## 2023-08-25 NOTE — Assessment & Plan Note (Signed)
 New right sided hearing loss with audiology and they recommended ENT referral which is done today.

## 2023-08-25 NOTE — Assessment & Plan Note (Signed)
 Stable on protonix  daily 40 mg continue.

## 2023-08-25 NOTE — Assessment & Plan Note (Signed)
 Flu shot yearly. Pneumonia complete. Shingrix  complete. Tetanus up to date. Colonoscopy up to date. Mammogram up to date, pap smear aged out and dexa complete. Counseled about sun safety and mole surveillance. Counseled about the dangers of distracted driving. Given 10 year screening recommendations.

## 2023-08-25 NOTE — Assessment & Plan Note (Signed)
 Checking vitamin D  and adjust as needed.

## 2023-08-25 NOTE — Assessment & Plan Note (Signed)
 Taking metoprolol  25 mg daily for prevention and rate fast heart beats without runs. Continue.

## 2023-08-28 ENCOUNTER — Ambulatory Visit: Payer: Self-pay | Admitting: Internal Medicine

## 2023-08-30 NOTE — Progress Notes (Signed)
 Patient was able to review results there were no question or concerns

## 2023-09-07 ENCOUNTER — Other Ambulatory Visit (HOSPITAL_COMMUNITY): Payer: Self-pay

## 2023-09-07 ENCOUNTER — Ambulatory Visit (INDEPENDENT_AMBULATORY_CARE_PROVIDER_SITE_OTHER)

## 2023-09-07 ENCOUNTER — Ambulatory Visit
Admission: RE | Admit: 2023-09-07 | Discharge: 2023-09-07 | Disposition: A | Discharge status: Home / Self Care | Source: Ambulatory Visit

## 2023-09-07 VITALS — BP 132/86 | Ht 63.0 in | Wt 135.0 lb

## 2023-09-07 DIAGNOSIS — M5442 Lumbago with sciatica, left side: Secondary | ICD-10-CM

## 2023-09-07 DIAGNOSIS — M47816 Spondylosis without myelopathy or radiculopathy, lumbar region: Secondary | ICD-10-CM | POA: Diagnosis not present

## 2023-09-07 DIAGNOSIS — M545 Low back pain, unspecified: Secondary | ICD-10-CM

## 2023-09-07 MED ORDER — PREDNISONE 20 MG PO TABS
40.0000 mg | ORAL_TABLET | Freq: Every day | ORAL | 0 refills | Status: DC
  Filled 2023-09-07: qty 10, 5d supply, fill #0

## 2023-09-07 NOTE — Progress Notes (Unsigned)
 PCP: Rollene Almarie LABOR, MD  Subjective:   HPI: Patient is a 68 y.o. female with past medical history of osteoporosis who presents today with acute low back pain with radiculopathy down her left leg.  The symptoms started roughly 1 week ago.  Cannot recall any inciting event.  She does states she was doing some mobility exercises in her home pool this past Sunday but does not recall any specific injury while performing these.  Last couple days she has had sharp pain with any anterior pelvic tilt or spinal extension.  Pain starts just inferolaterally of PSIS and radiates to her medial posterior thigh.  Says she also occasionally has some numbness tingling in her left lower leg as well.  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  She has been doing some home exercise program but is unable to get sufficient relief.  She has been taking naproxen once or twice daily but this does not seem to help much either.  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  No history of osteoporosis insufficiency fractures but she has declined any pharmaceutical intervention and lieu of vitamin D /calcium supplementation.  Past Medical History:  Diagnosis Date   GERD (gastroesophageal reflux disease)    takes Omeprazole and Protonix  daily   H/O hiatal hernia    History of bronchitis    15+yrs ago   History of colon polyps    History of vertigo    takes Antivert  daily as needed   Joint pain    Meniere's disease    L  hearing loss related to same   Osteoporosis    PMB (postmenopausal bleeding) 12/07   proliferative endo. No HRT, PUS normal    Current Outpatient Medications on File Prior to Visit  Medication Sig Dispense Refill   fluticasone  (FLONASE ) 50 MCG/ACT nasal spray Place 2 sprays into both nostrils daily. 16 g 6   meclizine  (ANTIVERT ) 12.5 MG tablet Take 1 tablet (12.5 mg total) by mouth 3 (three) times daily as needed for  dizziness. 90 tablet 3   metoprolol  succinate (TOPROL  XL) 25 MG 24 hr tablet Take 1 tablet (25 mg total) by mouth daily. 90 tablet 2   metroNIDAZOLE  (METROGEL ) 0.75 % gel Apply a small amount to face twice a day for rosacea 45 g 4   Multiple Vitamins-Minerals (MULTIVITAMIN WITH MINERALS) tablet Take 1 tablet by mouth daily.     pantoprazole  (PROTONIX ) 40 MG tablet Take 1 tablet (40 mg total) by mouth daily as needed for acid reflux 90 tablet 3   No current facility-administered medications on file prior to visit.    Past Surgical History:  Procedure Laterality Date   ABSCESS DRAINAGE  09/2010   suprapubic   BTSP  1994   CATARACT EXTRACTION EXTRACAPSULAR Right 10/2014   COLONOSCOPY W/ BIOPSIES  12/12/05   hyperplastic polyp, recheck in 10 years   CYSTECTOMY     from buttock   GAS/FLUID EXCHANGE Right 12/23/2013   Procedure: GAS/FLUID EXCHANGE;  Surgeon: Selinda KATHEE Slocumb, MD;  Location: [facility] OR;  Service: Ophthalmology;  Laterality: Right;  SF6   IRRIGATION AND DEBRIDEMENT SEBACEOUS CYST  1980 & 1985   MEMBRANE PEEL Right 12/23/2013   Procedure: MEMBRANE PEEL;  Surgeon: Selinda KATHEE Slocumb, MD;  Location: [facility] OR;  Service: Ophthalmology;  Laterality: Right;   PARS PLANA VITRECTOMY Right 10/07/2013   Procedure: PARS PLANA VITRECTOMY WITH 25 GAUGE RIGHT EYE;  Surgeon: Selinda KATHEE Slocumb, MD;  [facility] OR;  Service: Ophthalmology;  Laterality: Right;   PARS PLANA VITRECTOMY Right 12/23/2013   Procedure: PARS PLANA VITRECTOMY WITH 25 GAUGE;  Surgeon: Selinda KATHEE Slocumb, MD;  Location: [facility] OR;  Service: Ophthalmology;  Laterality: Right;   TUBAL LIGATION      Allergies  Allergen Reactions   Latex     Contact dermatitis   Sulfonamide Derivatives     Childhood allergy, kidney problems    BP 132/86   Ht 5' 3 (1.6 m)   Wt 135 lb (61.2 kg)   LMP 01/10/2006   BMI 23.91 kg/m       No data to display              No data to display              Objective:  Physical Exam:  Gen:  NAD, comfortable in exam room  ***   Assessment & Plan:  1. ***

## 2023-09-12 ENCOUNTER — Other Ambulatory Visit (HOSPITAL_COMMUNITY): Payer: Self-pay

## 2023-09-13 ENCOUNTER — Ambulatory Visit: Admitting: Family Medicine

## 2023-10-20 ENCOUNTER — Ambulatory Visit (INDEPENDENT_AMBULATORY_CARE_PROVIDER_SITE_OTHER)

## 2023-10-20 ENCOUNTER — Encounter (INDEPENDENT_AMBULATORY_CARE_PROVIDER_SITE_OTHER): Payer: Self-pay

## 2023-10-20 ENCOUNTER — Ambulatory Visit (INDEPENDENT_AMBULATORY_CARE_PROVIDER_SITE_OTHER): Admitting: Audiology

## 2023-10-20 VITALS — BP 124/78 | HR 69 | Temp 98.0°F | Ht 63.0 in | Wt 135.0 lb

## 2023-10-20 DIAGNOSIS — H903 Sensorineural hearing loss, bilateral: Secondary | ICD-10-CM

## 2023-10-20 DIAGNOSIS — H90A32 Mixed conductive and sensorineural hearing loss, unilateral, left ear with restricted hearing on the contralateral side: Secondary | ICD-10-CM

## 2023-10-20 DIAGNOSIS — H6123 Impacted cerumen, bilateral: Secondary | ICD-10-CM

## 2023-10-20 DIAGNOSIS — H8110 Benign paroxysmal vertigo, unspecified ear: Secondary | ICD-10-CM

## 2023-10-20 NOTE — Progress Notes (Signed)
  [address], [address] [place], [place] [address] [phone]  Audiological Evaluation    Name: [name]     DOB:   1955-06-23      MRN:   [id number]                                                                                     Service Date: 10/20/2023     Accompanied by: unaccompanied   Patient comes today after Dr. Penne Croak, DO sent a referral for a hearing evaluation due to concerns with hearing loss asymmetry.   Symptoms Yes Details  Hearing loss  [x]  Reports left ear hearing loss drop about 7 weeks ago. Reports today is better, but not quite as before. She still notices hearing to be worse in the left ear. Came with a hearing test from [facility], but it did not have bone conduction thresholds.  Tinnitus  []    Ear pain/ infections/pressure  []    Balance problems  [x]  Days later after the hearing change she had a vertigo spell. Afterwards had two more.   Noise exposure history  []    Previous ear surgeries  []    Family history of hearing loss  []    Amplification  [x]  Has two hearing aids from [facility].   Other  []      Otoscopy: Right ear: Clear external ear canal and notable landmarks visualized on the tympanic membrane. Left ear:  Clear external ear canal and notable landmarks visualized on the tympanic membrane.  Tympanometry: Right ear: Type A- Normal external ear canal volume with normal middle ear pressure and tympanic membrane compliance. Left ear: Type A- Normal external ear canal volume with normal middle ear pressure and tympanic membrane compliance.    Pure tone Audiometry: Right ear- Normal hearing from [id number] Hz, then mild  sensorineural hearing loss from 4000 Hz - 8000 Hz. Left ear-  Mild to moderately severe mixed hearing loss from 125 Hz - 8000 Hz. There was an air-bone gap at 250Hz .  Speech Audiometry: Right ear- Speech Reception Threshold (SRT) was obtained at 15 dBHL. Left ear-Speech Reception Threshold (SRT) was  obtained at 50 dBHL, with contralateral masking.   Word Recognition Score Tested using NU-6 (recorded) Right ear: 100% was obtained at a presentation level of 45 dBHL with contralateral masking which is deemed as  excellent. Left ear: 60% was obtained at a presentation level of 80 dBHL with contralateral masking which is deemed as  poor.   The hearing test results were completed under headphones and results are deemed to be of good reliability. Test technique:  conventional    Impression: Hearing and word recognition score asymmetry, worse in the left ear.   Recommendations: Follow up with ENT as scheduled for today. Return for a hearing evaluation if concerns with hearing changes arise or per MD recommendation. Consider a communication needs assessment after medical clearance for hearing aids is obtained. Repeat audiogram after medical care.   Aradia Estey MARIE LEROUX-MARTINEZ, AUD

## 2023-10-20 NOTE — Patient Instructions (Signed)
###   Epley Maneuver Instructions     **Instructions for Performing the Epley Maneuver**      1. **Start Position:** Have the patient sit upright on an exam table with legs extended. Turn the head 45 toward the affected ear (the side provoking vertigo).[1][2]      2. **Lie Back:** Assist the patient to lie back quickly so the head hangs slightly off the edge of the table (about 20 extension), still turned 45 toward the affected side. Hold this position for at least 30 seconds, or until any vertigo and nystagmus resolve.[1][2][3]      3. **Rotate Head:** Without lifting the head, turn it 90 toward the opposite (unaffected) side, so the face is now turned 45 toward the unaffected ear. Hold for at least 30 seconds.[1][2][3]      4. **Roll Body:** Ask the patient to roll onto their side in the direction they are facing, so the head is now turned downward (toward the floor) and the nose points down at a 45 angle. Hold for at least 30 seconds.[1][2][3]      5. **Sit Up:** Help the patient sit up slowly, keeping the head turned toward the unaffected side. Once upright, the head can be returned to neutral.[1][2][3]      **Tips:**      - Each position should be held for at least 30 seconds, or until symptoms resolve.      - A pillow under the shoulders may improve comfort and facilitate correct head extension.[3]      - After the maneuver, advise the patient to remain seated for about 15 minutes and to walk cautiously.[1]      - The maneuver may be repeated 2-3 times in one session if symptoms persist.[3][1]      **Contraindications:** Avoid the maneuver in patients with severe neck disease, unstable heart disease, or high-grade carotid stenosis.[4]      The Epley maneuver is highly effective, with symptom resolution rates up to 80-95% after one or more sessions.[1][2][3]      ### References  1. Benign Paroxysmal Positional Vertigo. Luke HARLAND North DS. The [place] Journal of Medicine.  2014;370(12):1138-47. doi:10.1056/NEJMcp1309481. 2. Clinical Practice Guideline: Benign Paroxysmal Positional Vertigo (Update). Milta SAILOR, Gubbels SP, Schwartz SR, et al. Otolaryngology--Head and Neck Surgery : Official Journal of American Academy of Otolaryngology-Head and Neck Surgery. 2017;156(3_suppl):S1-S47. doi:10.1177/0194599816689667. 3. The Semont-Plus Maneuver or the Epley Maneuver in Posterior Canal Benign Paroxysmal Positional Vertigo: A Randomized Clinical Study. Stevan CHRISTELLA Maurene CHRISTELLA, Vinck AS, et al. JAMA Neurology. 2023;80(8):798-804. doi:10.1001/jamaneurol.2023.1408. 4. Benign Paroxysmal Positional Vertigo. Danetta ONA Lesch SP. The [place] Journal of Medicine. 1999;341(21):1590-6. doi:10.1056/NEJM199911183412107.

## 2023-10-22 NOTE — Progress Notes (Signed)
 Dear Dr. Rollene, Here is my assessment for our mutual patient, [name]. Thank you for allowing me the opportunity to care for your patient. Please do not hesitate to contact me should you have any other questions. Sincerely, Dr. Penne Croak  Otolaryngology Clinic Note Referring provider: Dr. Rollene HPI:  Discussed the use of AI scribe software for clinical note transcription with the patient, who gave verbal consent to proceed.  History of Present Illness [name] is a 68 year old female with chronic left ear hearing loss who presents with recent right ear hearing loss and vertigo. She was referred by Dr. Rollene for evaluation of her hearing loss.  Bilateral hearing loss - Chronic left ear hearing loss for approximately 25 years, progressively worsening  - MRI performed in 2009 - Profound left ear hearing loss; unable to hear without a hearing aid - Comprehensive evaluation including MRI did not identify a cause for left ear hearing loss - Sudden onset of profound right ear hearing loss January 2025; previously the better ear - Right ear hearing loss initially accompanied by sensation of water  in the ear and a 'boing, boing, boing' sound with head movement - Right ear hearing has improved since initial onset; no longer experiences the 'boing' sensation - Uses hearing aids in both ears, equipped with a cross feature transmitting sound from left to right ear; finds this beneficial - Currently satisfied with hearing aids and hearing function - No ear drainage or itching - No use of ear drops  Vertigo - Severe episode of vertigo lasting approximately 12 hours following onset of right ear hearing loss - Vertigo characterized by room spinning, nausea, and vomiting - Two additional episodes of vertigo triggered by movement; alleviated by meclizine  - Utilized Epley maneuver with assistance from a colleague to manage vertigo symptoms - No current vertigo symptoms  Medication  and supplement use - Took methotrexate during initial vertigo episode - Uses meclizine  for vertigo episodes - Takes vitamin D  supplements for prior deficiency; most recent levels were normal   Independent Review of Additional Tests or Records:  Reviewed external note from referring PCP, Crawford,describing  Chief Complaint  Patient presents with   Hearing Loss  . Relevant history incorporated into today's evaluation.   I personally Ordered, reviewed and interpreted  - Left mixed hearing loss downsloping to moderate SNHL in the HF - Right ear with normal hearing down sloping to mild SNHL HF - Right ear word rec 100%, left ear 60%. Hearing improved bilaterally compared to outside audio.    Personally reviewed and interpreted outside auido - Left ear with severe mixed hearing loss Right ear with mild SNHL upsloping to normal then downsloping to moderate HF SNHL  MRI brain reviewed by me with patient dated 06/15/07 - no IAC mass  PMH/Meds/All/SocHx/FamHx/ROS:   Past Medical History:  Diagnosis Date   GERD (gastroesophageal reflux disease)    takes Omeprazole and Protonix  daily   H/O hiatal hernia    History of bronchitis    15+yrs ago   History of colon polyps    History of vertigo    takes Antivert  daily as needed   Joint pain    Meniere's disease    L  hearing loss related to same   Osteoporosis    PMB (postmenopausal bleeding) 12/07   proliferative endo. No HRT, PUS normal     Past Surgical History:  Procedure Laterality Date   ABSCESS DRAINAGE  09/2010   suprapubic   BTSP  1994   CATARACT EXTRACTION EXTRACAPSULAR Right 10/2014   COLONOSCOPY W/ BIOPSIES  12/12/05   hyperplastic polyp, recheck in 10 years   CYSTECTOMY     from buttock   GAS/FLUID EXCHANGE Right 12/23/2013   Procedure: GAS/FLUID EXCHANGE;  Surgeon: Selinda KATHEE Slocumb, MD;  Location: [facility] OR;  Service: Ophthalmology;  Laterality: Right;  SF6   IRRIGATION AND DEBRIDEMENT SEBACEOUS CYST  1980 & 1985    MEMBRANE PEEL Right 12/23/2013   Procedure: MEMBRANE PEEL;  Surgeon: Selinda KATHEE Slocumb, MD;  Location: [facility] OR;  Service: Ophthalmology;  Laterality: Right;   PARS PLANA VITRECTOMY Right 10/07/2013   Procedure: PARS PLANA VITRECTOMY WITH 25 GAUGE RIGHT EYE;  Surgeon: Selinda KATHEE Slocumb, MD;  Location[facility] OR;  Service: Ophthalmology;  Laterality: Right;   PARS PLANA VITRECTOMY Right 12/23/2013   Procedure: PARS PLANA VITRECTOMY WITH 25 GAUGE;  Surgeon: Selinda KATHEE Slocumb, MD;  Location: [facility] OR;  Service: Ophthalmology;  Laterality: Right;   TUBAL LIGATION      Family History  Problem Relation Age of Onset   Hypertension Mother    Atrial fibrillation Mother    Deep vein thrombosis Mother    Osteoporosis Mother        compression fx   Thyroid  disease Mother    Osteoarthritis Mother    Arthritis Mother        shoulder replacement   Stroke Father    Anorexia nervosa Sister    Osteoporosis Sister    Thyroid  disease Sister    Diabetes Brother    Cancer Paternal Grandmother        multi-organ     Social Connections: Unknown (08/18/2023)   Social Connection and Isolation Panel    Frequency of Communication with Friends and Family: Twice a week    Frequency of Social Gatherings with Friends and Family: Patient declined    Attends Religious Services: Patient declined    Database administrator or Organizations: No    Attends Engineer, structural: Not on file    Marital Status: Living with partner      Current Outpatient Medications:    fluticasone  (FLONASE ) 50 MCG/ACT nasal spray, Place 2 sprays into both nostrils daily., Disp: 16 g, Rfl: 6   meclizine  (ANTIVERT ) 12.5 MG tablet, Take 1 tablet (12.5 mg total) by mouth 3 (three) times daily as needed for dizziness., Disp: 90 tablet, Rfl: 3   metoprolol  succinate (TOPROL  XL) 25 MG 24 hr tablet, Take 1 tablet (25 mg total) by mouth daily., Disp: 90 tablet, Rfl: 2   metroNIDAZOLE  (METROGEL ) 0.75 % gel, Apply a small amount to face twice a day  for rosacea, Disp: 45 g, Rfl: 4   Multiple Vitamins-Minerals (MULTIVITAMIN WITH MINERALS) tablet, Take 1 tablet by mouth daily., Disp: , Rfl:    pantoprazole  (PROTONIX ) 40 MG tablet, Take 1 tablet (40 mg total) by mouth daily as needed for acid reflux, Disp: 90 tablet, Rfl: 3   predniSONE  (DELTASONE ) 20 MG tablet, Take 2 tablets (40 mg total) by mouth daily with breakfast. (Patient not taking: Reported on 10/20/2023), Disp: 10 tablet, Rfl: 0   Physical Exam:   BP 124/78 (BP Location: Right Arm, Patient Position: Sitting, Cuff Size: Normal)   Pulse 69   Temp 98 F (36.7 C) (Oral)   Ht 5' 3 (1.6 m)   Wt 135 lb (61.2 kg)   LMP 01/10/2006   SpO2 97%   BMI 23.91 kg/m   The patient was awake, alert, and  appropriate. The external ears were inspected, and otoscopy was performed to evaluate the external auditory canals and tympanic membranes. The nasal cavity and septum were examined for mucosal changes, obstruction, or discharge. The oral cavity and oropharynx were inspected for mucosal lesions, infection, or tonsillar hypertrophy. The neck was palpated for lymphadenopathy, thyroid  abnormalities, or other masses. Cranial nerve function was grossly intact.  Pertinent Findings: Physical Exam HEENT: Right ear canal with dry cerumen, eardrum normal. Left ear canal with cerumen, eardrum normal. Nose and oral cavity normal on examination. Dix hallpike neg Left rinne AC>BC  Seprately Identifiable Procedures:  I personally ordered, reviewed and interpreted the following with the patient today  Procedure: Bilateral ear microscopy and cerumen removal using microscope (CPT [phone]) - Mod 25 Pre-procedure diagnosis: Cerumen impaction external ear/ears Post-procedure diagnosis: same Indication:  cerumen impaction; given patient's otologic complaints and history as well as for improved and comprehensive examination of external ear and tympanic membrane, bilateral otologic examination using microscope was  performed and impacted cerumen removed  Procedure: Patient was placed semi-recumbent. Both ear canals were examined using the microscope with findings above. Cerumen removed on left and on right using suction and currette with improvement in EAC examination and patency. Left: EAC was patent. TM was intact . Middle ear was aerated. Drainage: absent Right: EAC was patent. TM was intact . Middle ear was aerated . Drainage: absent Patient tolerated the procedure well.      Impression & Plans:  [name] is a 68 y.o. female  1. Benign paroxysmal positional vertigo, unspecified laterality   2. Mixed conductive and sensorineural hearing loss of left ear with restricted hearing of right ear   3. Asymmetric SNHL (sensorineural hearing loss)   4. Bilateral impacted cerumen    - Findings and diagnoses discussed in detail with the patient. - Risks, benefits, and alternatives were reviewed. Through shared decision making, the patient elects to proceed with below. Assessment & Plan Chronic left-sided sensorineural hearing loss Profound left ear hearing loss with limited word recognition. Previous MRI normal. Current hearing aids use cross feature. BAHA may improve hearing but limited by duration of loss.  - Recheck hearing in 6 months. - She is happy with her hearing aids now that her right ear hearing returned. Consider BAHA evaluation if hearing worsens. - Review 2009 MRI images.  Recent right-sided sensorineural hearing loss, improved Acute right ear hearing loss with vertigo improved to near normal. Possible labyrinthitis or migraine. - Recheck hearing in 6 months.  - Hx consistent with BPPV but dix hallpike negative today in office.   - Orders placed:  Orders Placed This Encounter  Procedures   Ambulatory referral to Audiology   - Medications prescribed/continued/adjusted: No orders of the defined types were placed in this encounter.  - Education materials provided to the patient. -  Patient instructed to return sooner or go to the ED if new/worsening symptoms develop.   Thank you for allowing me the opportunity to care for your patient. Please do not hesitate to contact me should you have any other questions.  Sincerely, Penne Croak, DO Otolaryngologist (ENT) [facility] Phone: [phone] Fax: [phone]  10/22/2023, 1:13 PM

## 2023-11-02 ENCOUNTER — Other Ambulatory Visit (HOSPITAL_COMMUNITY): Payer: Self-pay

## 2023-11-02 MED ORDER — COVID-19 MRNA VAC-TRIS(PFIZER) 30 MCG/0.3ML IM SUSY
0.3000 mL | PREFILLED_SYRINGE | INTRAMUSCULAR | 0 refills | Status: AC
  Filled 2023-11-02: qty 0.3, 1d supply, fill #0

## 2023-11-24 ENCOUNTER — Other Ambulatory Visit (HOSPITAL_COMMUNITY): Payer: Self-pay

## 2023-11-24 ENCOUNTER — Other Ambulatory Visit: Payer: Self-pay | Admitting: Internal Medicine

## 2023-11-24 DIAGNOSIS — J0110 Acute frontal sinusitis, unspecified: Secondary | ICD-10-CM

## 2023-11-28 ENCOUNTER — Other Ambulatory Visit (HOSPITAL_COMMUNITY): Payer: Self-pay

## 2023-11-28 MED ORDER — FLUTICASONE PROPIONATE 50 MCG/ACT NA SUSP
2.0000 | Freq: Every day | NASAL | 3 refills | Status: AC
  Filled 2023-11-28: qty 48, 90d supply, fill #0

## 2023-12-04 ENCOUNTER — Encounter: Payer: Self-pay | Admitting: Family Medicine

## 2023-12-04 ENCOUNTER — Other Ambulatory Visit (HOSPITAL_COMMUNITY): Payer: Self-pay

## 2023-12-04 ENCOUNTER — Ambulatory Visit: Admitting: Family Medicine

## 2023-12-04 VITALS — BP 136/86 | Ht 63.0 in | Wt 135.0 lb

## 2023-12-04 DIAGNOSIS — M4726 Other spondylosis with radiculopathy, lumbar region: Secondary | ICD-10-CM | POA: Diagnosis not present

## 2023-12-04 MED ORDER — METHOCARBAMOL 750 MG PO TABS
750.0000 mg | ORAL_TABLET | Freq: Every day | ORAL | 1 refills | Status: DC
  Filled 2023-12-04: qty 14, 14d supply, fill #0
  Filled 2023-12-11: qty 14, 14d supply, fill #1
  Filled ????-??-??: fill #1

## 2023-12-04 MED ORDER — PREDNISONE 50 MG PO TABS
50.0000 mg | ORAL_TABLET | Freq: Every day | ORAL | 0 refills | Status: AC
Start: 2023-12-04 — End: ?
  Filled 2023-12-04: qty 5, 5d supply, fill #0

## 2023-12-04 NOTE — Progress Notes (Signed)
 DATE OF VISIT: 12/04/2023        [name] DOB: 10-11-1955 MRN: [id number]  Discussed the use of AI scribe software for clinical note transcription with the patient, who gave verbal consent to proceed.  History of Present Illness [name] is a 68 year old female with low back pain and sciatica who presents for follow-up of worsening symptoms.  Low back pain and radicular symptoms - Worsening left-sided low back pain with sciatica since last visit on September 07, 2023 - Initial episode developed gradually over one week in August, with no prior history of back issues - Pain radiates to the left buttock, medial thigh, and medial foot - Associated tingling in the left foot - Weakness in the left foot and leg, described as 'gets out on me', requiring her to go down stairs one step at a time - Pain increased after moving a patient at work last week, resulting in significant aching by the end of the day - Difficulty performing certain stretching exercises, including posterior and anterior tilts - Unable to lie on her stomach due to pain - No changes in bowel or bladder function  Symptom trajectory and response to treatment - Pain improved for approximately four weeks following initial treatment with prednisone  and home exercise program - Significant relief within two days of starting previous five-day course of prednisone  - Pain has since returned and worsened - No prior use of muscle relaxants  Pain management strategies - Uses ice during the day and heat at night for symptom relief - Ibuprofen taken for pain control - Heating pad used at night  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   predniSONE  (DELTASONE ) 50 MG tablet Take 1 tablet (50 mg total) by mouth daily.   COVID-19 mRNA  vaccine, Pfizer, (COMIRNATY ) syringe Inject 0.3 mLs into the muscle.   fluticasone  (FLONASE ) 50 MCG/ACT nasal spray Place 2 sprays into both nostrils daily.   meclizine  (ANTIVERT ) 12.5 MG tablet Take 1 tablet (12.5 mg total) by mouth 3 (three) times daily as needed for dizziness.   metoprolol  succinate (TOPROL  XL) 25 MG 24 hr tablet Take 1 tablet (25 mg total) by mouth daily.   metroNIDAZOLE  (METROGEL ) 0.75 % gel Apply a small amount to face twice a day for rosacea   Multiple Vitamins-Minerals (MULTIVITAMIN WITH MINERALS) tablet Take 1 tablet by mouth daily.   pantoprazole  (PROTONIX ) 40 MG tablet Take 1 tablet (40 mg total) by mouth daily as needed for acid reflux   [DISCONTINUED] predniSONE  (DELTASONE ) 20 MG tablet Take 2 tablets (40 mg total) by mouth daily with breakfast. (Patient not taking: Reported on 10/20/2023)   No facility-administered encounter medications on file as of 12/04/2023.    Allergies: is allergic to latex and sulfonamide derivatives.  Physical Examination: Vitals: BP 136/86   Ht 5' 3 (1.6 m)   Wt 135 lb (61.2 kg)   LMP 01/10/2006   BMI 23.91 kg/m  GENERAL:  [name] is a 68 y.o. female appearing their stated age, alert and oriented x 3, in no apparent distress.  SKIN: no rashes or lesions, skin clean, dry, intact MSK: L spine: No gross deformity.  Decreased range of motion with pain.  No midline tenderness.  Left-sided paraspinal tenderness throughout, no right-sided paraspinal tenderness.  Negative straight leg raise bilaterally.  Hips with good range of motion bilaterally with negative FABER.  Left hip flexor weakness 4-/5, otherwise lower extremity strength 5/5 throughout.  Able to toe walk and heel walk, but with pain.  Walking without a limp NEURO: Manage sensation to light touch along the left dorsum of the foot and first webspace, otherwise sensation intact to light touch lower extremity bilaterally, DTR 1/4 left Achilles and patella, 2/4 right Achilles and  patella VASC: pulses 2+ and symmetric P/PT bilaterally, no edema  Radiology: L-spine x-rays 09/07/2023 personally reviewed and interpreted by me today showing: - Multilevel degenerative changes with anterior endplate spurring - Lateral spurring also noted at multiple levels - Mild scoliosis concave right - No other acute abnormalities  Assessment & Plan Left-sided lumbar radiculopathy with left-sided low back pain, weakness, diminished reflexes of the left Achilles and patella, and decreased sensation in the left lower extremity, concern for herniated disc or pinched nerve  - Previous x-rays showed degenerative disc disease and mild scoliosis. Differential includes nerve impingement or disc herniation.  - Ordered MRI L-spine to assess for nerve impingement or disc herniation. - Prescribed prednisone  50 mg daily for 5 days.  Did well with previous steroid burst - Prescribed Robaxin  750 mg for muscle relaxation at bedtime.  Advised may cause drowsiness so should only take at bedtime - Advised to monitor for worsening symptoms, including increased pain, tripping, stumbling, or bowel/bladder dysfunction, and to seek emergency care if these occur. - Instructed to contact office if MRI results are not received within 5-7 days.  Will reach out with results through MyChart and discuss next steps  Patient expressed understanding & agreement with above.  Encounter Diagnosis  Name Primary?   Osteoarthritis of spine with radiculopathy, lumbar region Yes    Orders Placed This Encounter  Procedures   MR Lumbar Spine Wo Contrast     Contains text generated by [employer].

## 2023-12-05 ENCOUNTER — Encounter: Payer: Self-pay | Admitting: Family Medicine

## 2023-12-05 ENCOUNTER — Other Ambulatory Visit: Payer: Self-pay

## 2023-12-05 DIAGNOSIS — M4726 Other spondylosis with radiculopathy, lumbar region: Secondary | ICD-10-CM

## 2023-12-08 ENCOUNTER — Ambulatory Visit (HOSPITAL_COMMUNITY)
Admission: RE | Admit: 2023-12-08 | Discharge: 2023-12-08 | Disposition: A | Discharge status: Home / Self Care | Source: Ambulatory Visit | Attending: Family Medicine | Admitting: Family Medicine

## 2023-12-08 DIAGNOSIS — M5116 Intervertebral disc disorders with radiculopathy, lumbar region: Secondary | ICD-10-CM | POA: Diagnosis not present

## 2023-12-08 DIAGNOSIS — M4726 Other spondylosis with radiculopathy, lumbar region: Secondary | ICD-10-CM | POA: Insufficient documentation

## 2023-12-11 ENCOUNTER — Other Ambulatory Visit (HOSPITAL_COMMUNITY): Payer: Self-pay

## 2023-12-12 ENCOUNTER — Encounter: Payer: Self-pay | Admitting: Family Medicine

## 2023-12-12 ENCOUNTER — Ambulatory Visit: Payer: Self-pay | Admitting: Family Medicine

## 2023-12-13 ENCOUNTER — Encounter: Payer: Self-pay | Admitting: Family Medicine

## 2023-12-14 ENCOUNTER — Other Ambulatory Visit (HOSPITAL_COMMUNITY): Payer: Self-pay

## 2023-12-14 MED ORDER — DICLOFENAC SODIUM 75 MG PO TBEC
75.0000 mg | DELAYED_RELEASE_TABLET | Freq: Two times a day (BID) | ORAL | 1 refills | Status: AC | PRN
  Filled 2023-12-14: qty 60, 30d supply, fill #0
  Filled 2024-01-09: qty 60, 30d supply, fill #1

## 2023-12-14 MED ORDER — METHOCARBAMOL 750 MG PO TABS
750.0000 mg | ORAL_TABLET | Freq: Three times a day (TID) | ORAL | 1 refills | Status: DC | PRN
  Filled 2023-12-14 (×2): qty 30, 10d supply, fill #0
  Filled 2023-12-15: qty 4, 1d supply, fill #0
  Filled 2023-12-15 (×3): qty 26, 9d supply, fill #0
  Filled 2023-12-15 (×2): qty 4, 1d supply, fill #0
  Filled 2023-12-28: qty 30, 10d supply, fill #1

## 2023-12-15 ENCOUNTER — Other Ambulatory Visit (HOSPITAL_COMMUNITY): Payer: Self-pay

## 2023-12-15 ENCOUNTER — Other Ambulatory Visit: Payer: Self-pay

## 2023-12-25 ENCOUNTER — Other Ambulatory Visit

## 2023-12-26 DIAGNOSIS — M5126 Other intervertebral disc displacement, lumbar region: Secondary | ICD-10-CM | POA: Diagnosis not present

## 2023-12-26 DIAGNOSIS — M5416 Radiculopathy, lumbar region: Secondary | ICD-10-CM | POA: Diagnosis not present

## 2024-01-16 ENCOUNTER — Encounter: Payer: Self-pay | Admitting: Family Medicine

## 2024-01-16 ENCOUNTER — Other Ambulatory Visit (HOSPITAL_COMMUNITY): Payer: Self-pay

## 2024-01-16 MED ORDER — METHOCARBAMOL 750 MG PO TABS
750.0000 mg | ORAL_TABLET | Freq: Three times a day (TID) | ORAL | 1 refills | Status: AC | PRN
  Filled 2024-01-16: qty 30, 10d supply, fill #0

## 2024-04-19 ENCOUNTER — Ambulatory Visit (INDEPENDENT_AMBULATORY_CARE_PROVIDER_SITE_OTHER): Admitting: Audiology
